# Patient Record
Sex: Male | Born: 1975 | Hispanic: No | Marital: Married | Smoking: Never smoker
Health system: Southern US, Community
[De-identification: ages and names within clinical notes are randomized; demographics above are authoritative.]

## PROBLEM LIST (undated history)

## (undated) DIAGNOSIS — G510 Bell's palsy: Secondary | ICD-10-CM

## (undated) DIAGNOSIS — M109 Gout, unspecified: Secondary | ICD-10-CM

## (undated) DIAGNOSIS — Z98811 Dental restoration status: Secondary | ICD-10-CM

## (undated) DIAGNOSIS — M75100 Unspecified rotator cuff tear or rupture of unspecified shoulder, not specified as traumatic: Secondary | ICD-10-CM

## (undated) DIAGNOSIS — M7540 Impingement syndrome of unspecified shoulder: Secondary | ICD-10-CM

## (undated) DIAGNOSIS — M7500 Adhesive capsulitis of unspecified shoulder: Secondary | ICD-10-CM

## (undated) HISTORY — PX: NO PAST SURGERIES: SHX2092

## (undated) HISTORY — PX: OTHER SURGICAL HISTORY: SHX169

---

## 2011-10-18 ENCOUNTER — Encounter (HOSPITAL_BASED_OUTPATIENT_CLINIC_OR_DEPARTMENT_OTHER): Payer: Self-pay | Admitting: *Deleted

## 2011-10-18 DIAGNOSIS — M75100 Unspecified rotator cuff tear or rupture of unspecified shoulder, not specified as traumatic: Secondary | ICD-10-CM

## 2011-10-18 DIAGNOSIS — M25819 Other specified joint disorders, unspecified shoulder: Secondary | ICD-10-CM

## 2011-10-18 DIAGNOSIS — M7540 Impingement syndrome of unspecified shoulder: Secondary | ICD-10-CM

## 2011-10-18 DIAGNOSIS — M7500 Adhesive capsulitis of unspecified shoulder: Secondary | ICD-10-CM

## 2011-10-18 HISTORY — DX: Impingement syndrome of unspecified shoulder: M75.40

## 2011-10-18 HISTORY — DX: Adhesive capsulitis of unspecified shoulder: M75.00

## 2011-10-18 HISTORY — DX: Other specified joint disorders, unspecified shoulder: M25.819

## 2011-10-18 HISTORY — DX: Unspecified rotator cuff tear or rupture of unspecified shoulder, not specified as traumatic: M75.100

## 2011-10-25 ENCOUNTER — Encounter (HOSPITAL_BASED_OUTPATIENT_CLINIC_OR_DEPARTMENT_OTHER): Payer: Self-pay | Admitting: Anesthesiology

## 2011-10-25 ENCOUNTER — Ambulatory Visit (HOSPITAL_BASED_OUTPATIENT_CLINIC_OR_DEPARTMENT_OTHER)
Admission: RE | Admit: 2011-10-25 | Discharge: 2011-10-25 | Disposition: A | Discharge status: Home / Self Care | Payer: BC Managed Care – PPO | Source: Ambulatory Visit | Attending: Orthopedic Surgery | Admitting: Orthopedic Surgery

## 2011-10-25 ENCOUNTER — Ambulatory Visit (HOSPITAL_BASED_OUTPATIENT_CLINIC_OR_DEPARTMENT_OTHER): Payer: BC Managed Care – PPO | Admitting: Anesthesiology

## 2011-10-25 ENCOUNTER — Encounter (HOSPITAL_BASED_OUTPATIENT_CLINIC_OR_DEPARTMENT_OTHER): Payer: Self-pay

## 2011-10-25 ENCOUNTER — Encounter (HOSPITAL_BASED_OUTPATIENT_CLINIC_OR_DEPARTMENT_OTHER)
Admission: RE | Disposition: A | Discharge status: Home / Self Care | Payer: Self-pay | Source: Ambulatory Visit | Attending: Orthopedic Surgery

## 2011-10-25 DIAGNOSIS — M7541 Impingement syndrome of right shoulder: Secondary | ICD-10-CM

## 2011-10-25 DIAGNOSIS — M659 Unspecified synovitis and tenosynovitis, unspecified site: Secondary | ICD-10-CM | POA: Insufficient documentation

## 2011-10-25 DIAGNOSIS — M24119 Other articular cartilage disorders, unspecified shoulder: Secondary | ICD-10-CM | POA: Insufficient documentation

## 2011-10-25 DIAGNOSIS — S43429A Sprain of unspecified rotator cuff capsule, initial encounter: Secondary | ICD-10-CM | POA: Insufficient documentation

## 2011-10-25 DIAGNOSIS — X58XXXA Exposure to other specified factors, initial encounter: Secondary | ICD-10-CM | POA: Insufficient documentation

## 2011-10-25 DIAGNOSIS — M7500 Adhesive capsulitis of unspecified shoulder: Secondary | ICD-10-CM | POA: Insufficient documentation

## 2011-10-25 HISTORY — DX: Unspecified rotator cuff tear or rupture of unspecified shoulder, not specified as traumatic: M75.100

## 2011-10-25 HISTORY — DX: Dental restoration status: Z98.811

## 2011-10-25 HISTORY — DX: Adhesive capsulitis of unspecified shoulder: M75.00

## 2011-10-25 HISTORY — DX: Impingement syndrome of unspecified shoulder: M75.40

## 2011-10-25 SURGERY — ARTHROSCOPY, SHOULDER, WITH ROTATOR CUFF REPAIR
Anesthesia: General | Site: Shoulder | Laterality: Right | Wound class: Clean

## 2011-10-25 MED ORDER — HYDROMORPHONE HCL PF 1 MG/ML IJ SOLN: 0.2500 mg | INTRAMUSCULAR | Status: DC | PRN

## 2011-10-25 MED ORDER — CEFAZOLIN SODIUM-DEXTROSE 2-3 GM-% IV SOLR: 2.0000 g | Freq: Once | INTRAVENOUS | Status: DC

## 2011-10-25 MED ORDER — DEXAMETHASONE SODIUM PHOSPHATE 4 MG/ML IJ SOLN
INTRAMUSCULAR | Status: DC | PRN
  Administered 2011-10-25: 10 mg via INTRAVENOUS

## 2011-10-25 MED ORDER — OXYCODONE-ACETAMINOPHEN 5-325 MG PO TABS: 1.0000 | ORAL_TABLET | ORAL | Status: AC | PRN

## 2011-10-25 MED ORDER — FENTANYL CITRATE 0.05 MG/ML IJ SOLN
50.0000 ug | INTRAMUSCULAR | Status: DC | PRN
  Administered 2011-10-25: 50 ug via INTRAVENOUS

## 2011-10-25 MED ORDER — SODIUM CHLORIDE 0.9 % IR SOLN
Status: DC | PRN
  Administered 2011-10-25: 5000 mL

## 2011-10-25 MED ORDER — MIDAZOLAM HCL 2 MG/2ML IJ SOLN
1.0000 mg | INTRAMUSCULAR | Status: DC | PRN
  Administered 2011-10-25: 1 mg via INTRAVENOUS

## 2011-10-25 MED ORDER — SUCCINYLCHOLINE CHLORIDE 20 MG/ML IJ SOLN
INTRAMUSCULAR | Status: DC | PRN
  Administered 2011-10-25: 100 mg via INTRAVENOUS

## 2011-10-25 MED ORDER — LACTATED RINGERS IV SOLN
INTRAVENOUS | Status: DC
  Administered 2011-10-25 (×2): via INTRAVENOUS

## 2011-10-25 MED ORDER — PROPOFOL 10 MG/ML IV EMUL
INTRAVENOUS | Status: DC | PRN
  Administered 2011-10-25: 200 mg via INTRAVENOUS

## 2011-10-25 MED ORDER — TRIAMCINOLONE ACETONIDE 40 MG/ML IJ SUSP
INTRAMUSCULAR | Status: DC | PRN
  Administered 2011-10-25: 40 mg via INTRA_ARTICULAR

## 2011-10-25 MED ORDER — ONDANSETRON HCL 4 MG/2ML IJ SOLN
INTRAMUSCULAR | Status: DC | PRN
  Administered 2011-10-25: 4 mg via INTRAVENOUS

## 2011-10-25 MED ORDER — BUPIVACAINE-EPINEPHRINE PF 0.5-1:200000 % IJ SOLN
INTRAMUSCULAR | Status: DC | PRN
  Administered 2011-10-25: 30 mL

## 2011-10-25 MED ORDER — FENTANYL CITRATE 0.05 MG/ML IJ SOLN
INTRAMUSCULAR | Status: DC | PRN
  Administered 2011-10-25: 100 ug via INTRAVENOUS

## 2011-10-25 SURGICAL SUPPLY — 84 items
BENZOIN TINCTURE PRP APPL 2/3 (GAUZE/BANDAGES/DRESSINGS) IMPLANT
BLADE 4.2CUDA (BLADE) IMPLANT
BLADE CUDA 5.5 (BLADE) IMPLANT
BLADE CUTTER GATOR 3.5 (BLADE) IMPLANT
BLADE GREAT WHITE 4.2 (BLADE) IMPLANT
BLADE SURG 15 STRL LF DISP TIS (BLADE) IMPLANT
BLADE SURG 15 STRL SS (BLADE)
BLADE SURG ROTATE 9660 (MISCELLANEOUS) IMPLANT
BUR 3.5 LG SPHERICAL (BURR) IMPLANT
BUR OVAL 4.0 (BURR) ×2 IMPLANT
BUR OVAL 6.0 (BURR) IMPLANT
BURR 3.5 LG SPHERICAL (BURR)
CANISTER OMNI JUG 16 LITER (MISCELLANEOUS) ×2 IMPLANT
CANISTER SUCTION 2500CC (MISCELLANEOUS) IMPLANT
CANNULA 5.75X71 LONG (CANNULA) ×2 IMPLANT
CANNULA TWIST IN 8.25X7CM (CANNULA) IMPLANT
CHLORAPREP W/TINT 26ML (MISCELLANEOUS) ×2 IMPLANT
CLOTH BEACON ORANGE TIMEOUT ST (SAFETY) ×2 IMPLANT
DECANTER SPIKE VIAL GLASS SM (MISCELLANEOUS) IMPLANT
DERMABOND ADVANCED (GAUZE/BANDAGES/DRESSINGS)
DERMABOND ADVANCED .7 DNX12 (GAUZE/BANDAGES/DRESSINGS) IMPLANT
DRAPE INCISE IOBAN 66X45 STRL (DRAPES) ×2 IMPLANT
DRAPE STERI 35X30 U-POUCH (DRAPES) ×2 IMPLANT
DRAPE SURG 17X23 STRL (DRAPES) ×2 IMPLANT
DRAPE U 20/CS (DRAPES) ×2 IMPLANT
DRAPE U-SHAPE 47X51 STRL (DRAPES) ×2 IMPLANT
DRAPE U-SHAPE 76X120 STRL (DRAPES) ×4 IMPLANT
DRSG PAD ABDOMINAL 8X10 ST (GAUZE/BANDAGES/DRESSINGS) ×2 IMPLANT
ELECT REM PT RETURN 9FT ADLT (ELECTROSURGICAL) ×2
ELECTRODE REM PT RTRN 9FT ADLT (ELECTROSURGICAL) ×1 IMPLANT
GAUZE SPONGE 4X4 16PLY XRAY LF (GAUZE/BANDAGES/DRESSINGS) IMPLANT
GAUZE XEROFORM 1X8 LF (GAUZE/BANDAGES/DRESSINGS) ×2 IMPLANT
GLOVE BIO SURGEON STRL SZ7.5 (GLOVE) ×6 IMPLANT
GLOVE BIOGEL M STRL SZ7.5 (GLOVE) ×2 IMPLANT
GLOVE BIOGEL PI IND STRL 8 (GLOVE) ×3 IMPLANT
GLOVE BIOGEL PI INDICATOR 8 (GLOVE) ×3
GLOVE INDICATOR 8.0 STRL GRN (GLOVE) ×2 IMPLANT
GOWN PREVENTION PLUS XLARGE (GOWN DISPOSABLE) ×6 IMPLANT
NDL SUT 6 .5 CRC .975X.05 MAYO (NEEDLE) IMPLANT
NEEDLE 1/2 CIR CATGUT .05X1.09 (NEEDLE) IMPLANT
NEEDLE MAYO TAPER (NEEDLE)
NEEDLE SCORPION MULTI FIRE (NEEDLE) IMPLANT
NS IRRIG 1000ML POUR BTL (IV SOLUTION) IMPLANT
PACK ARTHROSCOPY DSU (CUSTOM PROCEDURE TRAY) ×2 IMPLANT
PACK BASIN DAY SURGERY FS (CUSTOM PROCEDURE TRAY) ×2 IMPLANT
PENCIL BUTTON HOLSTER BLD 10FT (ELECTRODE) IMPLANT
RESECTOR FULL RADIUS 4.2MM (BLADE) ×2 IMPLANT
RESECTOR FULL RADIUS 4.8MM (BLADE) IMPLANT
SHEET MEDIUM DRAPE 40X70 STRL (DRAPES) ×2 IMPLANT
SLEEVE SCD COMPRESS KNEE MED (MISCELLANEOUS) ×2 IMPLANT
SLING ARM FOAM STRAP LRG (SOFTGOODS) IMPLANT
SLING ARM FOAM STRAP MED (SOFTGOODS) IMPLANT
SLING ARM FOAM STRAP XLG (SOFTGOODS) ×2 IMPLANT
SLING ARM IMMOBILIZER MED (SOFTGOODS) IMPLANT
SPONGE GAUZE 4X4 12PLY (GAUZE/BANDAGES/DRESSINGS) ×2 IMPLANT
SPONGE LAP 4X18 X RAY DECT (DISPOSABLE) IMPLANT
STRIP CLOSURE SKIN 1/2X4 (GAUZE/BANDAGES/DRESSINGS) IMPLANT
SUCTION FRAZIER TIP 10 FR DISP (SUCTIONS) IMPLANT
SUPPORT WRAP ARM LG (MISCELLANEOUS) IMPLANT
SUT 2 FIBERLOOP 20 STRT BLUE (SUTURE)
SUT BONE WAX W31G (SUTURE) IMPLANT
SUT ETHIBOND 2 OS 4 DA (SUTURE) IMPLANT
SUT ETHILON 3 0 PS 1 (SUTURE) ×2 IMPLANT
SUT ETHILON 4 0 PS 2 18 (SUTURE) ×2 IMPLANT
SUT FIBERWIRE #2 38 T-5 BLUE (SUTURE)
SUT FIBERWIRE 2-0 18 17.9 3/8 (SUTURE)
SUT MNCRL AB 3-0 PS2 18 (SUTURE) IMPLANT
SUT MNCRL AB 4-0 PS2 18 (SUTURE) IMPLANT
SUT PDS AB 0 CT 36 (SUTURE) IMPLANT
SUT PROLENE 3 0 PS 2 (SUTURE) ×2 IMPLANT
SUT VIC AB 0 CT1 18XCR BRD 8 (SUTURE) IMPLANT
SUT VIC AB 0 CT1 8-18 (SUTURE)
SUT VIC AB 2-0 SH 18 (SUTURE) IMPLANT
SUTURE 2 FIBERLOOP 20 STRT BLU (SUTURE) IMPLANT
SUTURE FIBERWR #2 38 T-5 BLUE (SUTURE) IMPLANT
SUTURE FIBERWR 2-0 18 17.9 3/8 (SUTURE) IMPLANT
SYR BULB 3OZ (MISCELLANEOUS) IMPLANT
TOWEL OR 17X24 6PK STRL BLUE (TOWEL DISPOSABLE) ×2 IMPLANT
TOWEL OR NON WOVEN STRL DISP B (DISPOSABLE) ×2 IMPLANT
TUBE CONNECTING 20X1/4 (TUBING) ×2 IMPLANT
TUBING ARTHROSCOPY IRRIG 16FT (MISCELLANEOUS) ×2 IMPLANT
WAND STAR VAC 90 (SURGICAL WAND) ×2 IMPLANT
WATER STERILE IRR 1000ML POUR (IV SOLUTION) ×2 IMPLANT
YANKAUER SUCT BULB TIP NO VENT (SUCTIONS) IMPLANT

## 2011-10-25 NOTE — Anesthesia Postprocedure Evaluation (Signed)
  Anesthesia Post-op Note  Patient: Stephen Vega  Procedure(s) Performed: Procedure(s) (LRB): SHOULDER ARTHROSCOPY WITH ROTATOR CUFF REPAIR (Right)  Patient Location: PACU  Anesthesia Type: GA combined with regional for post-op pain  Level of Consciousness: awake, alert  and oriented  Airway and Oxygen Therapy: Patient Spontanous Breathing  Post-op Pain: none  Post-op Assessment: Post-op Vital signs reviewed, Patient's Cardiovascular Status Stable, Respiratory Function Stable, Patent Airway, No signs of Nausea or vomiting and Pain level controlled  Post-op Vital Signs: Reviewed and stable  Complications: No apparent anesthesia complications

## 2011-10-25 NOTE — Discharge Instructions (Signed)
Discharge Instructions after Arthroscopic Shoulder Surgery ° ° °A sling has been provided for you. You may remove the sling after 72 hours. The sling may be worn for your protection, if you are in a crowd.  °Use ice on the shoulder intermittently over the first 48 hours after surgery.  °Pain medication has been prescribed for you.  °Use your medication liberally over the first 48 hours, and then begin to taper your use. You may take Extra Strength Tylenol or Tylenol only in place of the pain pills. DO NOT take ANY nonsteroidal anti-inflammatory pain medications: Advil, Motrin, Ibuprofen, Aleve, Naproxen, or Naprosyn.  °You may remove your dressing after two days.  °You may shower 5 days after surgery. The incision CANNOT get wet prior to 5 days. Simply allow the water to wash over the site and then pat dry. Do not rub the incision. Make sure your axilla (armpit) is completely dry after showering.  °Take one aspirin a day for 2 weeks after surgery, unless you have an aspirin sensitivity/allergy or asthma.  °Three to 5 times each day you should perform assisted overhead reaching and external rotation (outward turning) exercises with the operative arm. Both exercises should be done with the non-operative arm used as the "therapist arm" while the operative arm remains relaxed. Ten of each exercise should be done three to five times each day. ° ° ° °Overhead reach is helping to lift your stiff arm up as high as it will go. To stretch your overhead reach, lie flat on your back, relax, and grasp the wrist of the tight shoulder with your opposite hand. Using the power in your opposite arm, bring the stiff arm up as far as it is comfortable. Start holding it for ten seconds and then work up to where you can hold it for a count of 30. Breathe slowly and deeply while the arm is moved. Repeat this stretch ten times, trying to help the arm up a little higher each time.  ° ° ° ° ° °External rotation is turning the arm out to  the side while your elbow stays close to your body. External rotation is best stretched while you are lying on your back. Hold a cane, yardstick, broom handle, or dowel in both hands. Bend both elbows to a right angle. Use steady, gentle force from your normal arm to rotate the hand of the stiff shoulder out away from your body. Continue the rotation as far as it will go comfortably, holding it there for a count of 10. Repeat this exercise ten times.  ° ° ° °Please call [phone] during normal business hours or [phone] after hours for any problems. Including the following: ° °- excessive redness of the incisions °- drainage for more than 4 days °- fever of more than 101.5 F ° °*Please note that pain medications will not be refilled after hours or on weekends. ° ° ° °Post Anesthesia Home Care Instructions ° °Activity: °Get plenty of rest for the remainder of the day. A responsible adult should stay with you for 24 hours following the procedure.  °For the next 24 hours, DO NOT: °-Drive a car °-Operate machinery °-Drink alcoholic beverages °-Take any medication unless instructed by your physician °-Make any legal decisions or sign important papers. ° °Meals: °Start with liquid foods such as gelatin or soup. Progress to regular foods as tolerated. Avoid greasy, spicy, heavy foods. If nausea and/or vomiting occur, drink only clear liquids until the nausea and/or vomiting subsides. Call   your physician if vomiting continues. ° °Special Instructions/Symptoms: °Your throat may feel dry or sore from the anesthesia or the breathing tube placed in your throat during surgery. If this causes discomfort, gargle with warm salt water. The discomfort should disappear within 24 hours. ° ° °Regional Anesthesia Blocks ° °1. Numbness or the inability to move the "blocked" extremity may last from 3-48 hours after placement. The length of time depends on the medication injected and your individual response to the medication. If the  numbness is not going away after 48 hours, call your surgeon. ° °2. The extremity that is blocked will need to be protected until the numbness is gone and the  Strength has returned. Because you cannot feel it, you will need to take extra care to avoid injury. Because it may be weak, you may have difficulty moving it or using it. You may not know what position it is in without looking at it while the block is in effect. ° °3. For blocks in the legs and feet, returning to weight bearing and walking needs to be done carefully. You will need to wait until the numbness is entirely gone and the strength has returned. You should be able to move your leg and foot normally before you try and bear weight or walk. You will need someone to be with you when you first try to ensure you do not fall and possibly risk injury. ° °4. Bruising and tenderness at the needle site are common side effects and will resolve in a few days. ° °5. Persistent numbness or new problems with movement should be communicated to the surgeon or the [facility] ([phone])/ [facility] ([phone]). °

## 2011-10-25 NOTE — Progress Notes (Signed)
Assisted Dr. Jackson with right, interscalene  block. Side rails up, monitors on throughout procedure. See vital signs in flow sheet. Tolerated Procedure well. 

## 2011-10-25 NOTE — Transfer of Care (Signed)
Immediate Anesthesia Transfer of Care Note  Patient: Stephen Vega  Procedure(s) Performed: Procedure(s) (LRB): SHOULDER ARTHROSCOPY WITH ROTATOR CUFF REPAIR (Right)  Patient Location: PACU  Anesthesia Type: GA combined with regional for post-op pain  Level of Consciousness: awake, alert  and oriented  Airway & Oxygen Therapy: Patient Spontanous Breathing and Patient connected to face mask oxygen  Post-op Assessment: Report given to PACU RN and Post -op Vital signs reviewed and stable  Post vital signs: Reviewed and stable  Complications: No apparent anesthesia complications

## 2011-10-25 NOTE — Anesthesia Preprocedure Evaluation (Signed)
Anesthesia Evaluation  Patient identified by MRN, date of birth, ID band Patient awake    Reviewed: Allergy & Precautions, H&P , NPO status , Patient's Chart, lab work & pertinent test results  History of Anesthesia Complications Negative for: history of anesthetic complications  Airway Mallampati: II TM Distance: >3 FB Neck ROM: Full    Dental No notable dental hx. (+) Chipped, Caps and Dental Advisory Given   Pulmonary neg pulmonary ROS,  breath sounds clear to auscultation  Pulmonary exam normal       Cardiovascular negative cardio ROS  Rhythm:Regular Rate:Normal     Neuro/Psych negative neurological ROS  negative psych ROS   GI/Hepatic negative GI ROS, Neg liver ROS,   Endo/Other  Morbid obesity  Renal/GU negative Renal ROS     Musculoskeletal   Abdominal (+) + obese,   Peds  Hematology negative hematology ROS (+)   Anesthesia Other Findings   Reproductive/Obstetrics                           Anesthesia Physical Anesthesia Plan  ASA: I  Anesthesia Plan: General   Post-op Pain Management:    Induction: Intravenous  Airway Management Planned: Oral ETT  Additional Equipment:   Intra-op Plan:   Post-operative Plan: Extubation in OR  Informed Consent: I have reviewed the patients History and Physical, chart, labs and discussed the procedure including the risks, benefits and alternatives for the proposed anesthesia with the patient or authorized representative who has indicated his/her understanding and acceptance.   Dental advisory given  Plan Discussed with: CRNA and Surgeon  Anesthesia Plan Comments: (Plan routine monitors, GETA with interscalene block for post-op analgesia)        Anesthesia Quick Evaluation

## 2011-10-25 NOTE — Op Note (Signed)
Procedure(s): SHOULDER ARTHROSCOPY WITH ROTATOR CUFF REPAIR Procedure Note  Stephen Vega male 36 y.o. 10/25/2011  Procedure(s) and Anesthesia Type:    * Right SHOULDER ARTHROSCOPY WITH lysis of adhesions, subacromial decompression, manipulation under anesthesia - General  Postoperative diagnosis: Right shoulder impingement, frozen shoulder, synovitis.  Surgeon(s) and Role:    * Mable Paris, MD - Primary     Surgeon: Mable Paris   Assistants: None  Anesthesia: General endotracheal anesthesia and regional    Procedure Detail  SHOULDER ARTHROSCOPY WITH ROTATOR CUFF REPAIR  Estimated Blood Loss: Min         Drains: none  Blood Given: none         Specimens: none        Complications:  * No complications entered in OR log *         Disposition: PACU - hemodynamically stable.         Condition: stable    Procedure:   INDICATIONS FOR SURGERY: The patient is 36 y.o. male who has had over a six-month history of right shoulder pain and stiffness. He had an MRI which showed high-grade partial to near full-thickness anterior rotator cuff tear and unfavorable anterior acromial anatomy. There is also some question of possible subscapularis tear. He was noted clinically to have significant stiffness. He failed conservative management and given his MRI findings was indicated for arthroscopic examination and surgery. He understood risks benefits and alternatives to the procedure including but not limited to risk of bleeding infection damage to neurovascular structures, risk of incomplete pain relief, and recurrent stiffness. He elected to go forward with surgery.  OPERATIVE FINDINGS: Examination under anesthesia: Limited motion with 170 forward flexion, 40 external rotation at the side, 80 abducted external rotation, 0 abducted internal rotation. After a gentle manipulation his motion was increased to 180 forward flexion, 70 external rotation at the side,  100 abducted external rotation, and 60 abducted internal rotation. No instability was noted. Diagnostic Arthroscopy:  Glenoid articular cartilage: Intact Humeral head articular cartilage: Intact Labrum: Mild degenerative changes and synovitis Loose bodies: None Synovitis: Extensive throughout the humeral joint and subacromial space. Articular sided rotator cuff: Intact, synovitis. Bursal sided rotator cuff: Intact, synovitis. Coracoacromial ligament: Thickened and with synovitis.  DESCRIPTION OF PROCEDURE: The patient was identified in preoperative  holding area where I personally marked the operative site after  verifying site, side, and procedure with the patient. An interscalene block was given by the attending anesthesiologist the holding area.  The patient was taken back to the operating room where general anesthesia was induced without complication and was placed in the beach-chair position with the back  elevated about 60 degrees and all extremities and head and neck carefully padded and  positioned.   The right upper extremity was then prepped and  draped in a standard sterile fashion. The appropriate time-out  procedure was carried out. The patient did receive IV antibiotics  within 30 minutes of incision.   A small posterior portal incision was made and the arthroscope was introduced into the joint. An anterior portal was then established above the subscapularis using needle localization. Small cannula was placed anteriorly. Diagnostic arthroscopy was then carried out with findings as described above.  There was extensive synovitis noted in the joint. The shaver and ArthriCare were used through the anterior portal to perform extensive synovectomy. Given the stiffness noted on exam I also did a limited capsular release in the rotator interval. Great care was taken to protect structures  of the rotator interval. The subscapularis was carefully examined and found to be completely  intact. The superior rotator cuff and posterior rotator cuff were also carefully examined and found to be completely intact. Special attention was paid to the area just posterior to the biceps tendon given the findings on MRI. The biceps tendon was completely intact. There is no exposed tuberosity and the rotator cuff was noted to be completely intact. Camera was placed in the anterior portal to examine posterior structures. The posterior labrum and capsule were intact with some synovitis.  The arthroscope was then introduced into the subacromial space a standard lateral portal was established with needle localization. The shaver was used through the lateral portal to perform extensive bursectomy. Coracoacromial ligament was examined and found to be thickened and synovitic. After extensive bursectomy the bursal surface of the rotator cuff was carefully examined. The entire anterior and posterior bursal surface of the rotator cuff was intact without any full or partial thickness tearing. There was some erythema anterior on the rotator cuff consistent with impingement.  The coracoacromial ligament was taken down off the anterior acromion with the ArthroCare exposing a moderate anterior acromial spur. A high-speed bur was then used through the lateral portal to take down the anterior acromial spur from lateral to medial in a standard acromioplasty.  The acromioplasty was also viewed from the lateral portal and the bur was used as necessary to ensure that the acromion was completely flat from posterior to anterior.  The arthroscopic equipment was removed from the joint and the portals were closed with 3-0 nylon in an interrupted fashion. 40 mg of Kenalog were introduced into the glenohumeral joint prior to closure of portals.  Sterile dressings were then applied including Xeroform 4 x 4's ABDs and tape. After dressings were applied a second gentle manipulation was performed. The patient was then allowed to awaken  from general anesthesia, placed in a sling, transferred to the stretcher and taken to the recovery room in stable condition.   POSTOPERATIVE PLAN: The patient will be discharged home today and will followup in one week for suture removal and wound check.  He will begin aggressive stretching exercises within the next couple days and we'll start formal physical therapy at his first postoperative visit.Marland Kitchen

## 2011-10-25 NOTE — Anesthesia Procedure Notes (Addendum)
Anesthesia Regional Block:  Interscalene brachial plexus block  Pre-Anesthetic Checklist: ,, timeout performed, Correct Patient, Correct Site, Correct Laterality, Correct Procedure, Correct Position, site marked, Risks and benefits discussed,  Surgical consent,  Pre-op evaluation,  At surgeon's request and post-op pain management  Laterality: Right  Prep: chloraprep       Needles:  Injection technique: Single-shot  Needle Type: Stimulator Needle - 40      Needle Gauge: 22 and 22 G    Additional Needles:  Procedures: nerve stimulator Interscalene brachial plexus block  Nerve Stimulator or Paresthesia:  Response: forearm twitch, 0.5 mA, 0.1 ms,   Additional Responses:   Narrative:  Start time: 10/25/2011 12:36 PM End time: 10/25/2011 12:41 PM Injection made incrementally with aspirations every 5 mL.  Performed by: Personally  Anesthesiologist: Sandford Craze, MD  Additional Notes: Pt identified in Holding room.  Monitors applied. Working IV access confirmed. Sterile prep R neck.  #22ga PNS to forearm twitch at 0.69mA threshold.  30cc 0.5% Bupivacaine with 1:200k epi injected incrementally after negative test dose.  Patient asymptomatic, VSS, no heme aspirated, tolerated well.   Sandford Craze, MD  Interscalene brachial plexus block Procedure Name: Intubation Date/Time: 10/25/2011 1:29 PM Performed by: Burna Cash Pre-anesthesia Checklist: Patient identified, Emergency Drugs available, Suction available and Patient being monitored Patient Re-evaluated:Patient Re-evaluated prior to inductionOxygen Delivery Method: Circle System Utilized Preoxygenation: Pre-oxygenation with 100% oxygen Intubation Type: IV induction Ventilation: Mask ventilation without difficulty Laryngoscope Size: Mac and 3 Grade View: Grade I Tube type: Oral Number of attempts: 1 Airway Equipment and Method: stylet and oral airway Placement Confirmation: ETT inserted through vocal cords under direct vision,   positive ETCO2 and breath sounds checked- equal and bilateral Secured at: 22 cm Tube secured with: Tape Dental Injury: Teeth and Oropharynx as per pre-operative assessment

## 2011-10-25 NOTE — H&P (Signed)
Stephen Vega is an 35 y.o. male.   Chief Complaint: R shoudler pain and stiffness HPI: R shoulder pain and stiffness with MRI confirmed near full thickness rotator cuff tear and impingement.  Past Medical History  Diagnosis Date  . Dental crowns present   . Rotator cuff tear 10/2011    right  . Shoulder impingement 10/2011    right  . 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 No past surgeries     History reviewed. No pertinent family history. Social History:  reports that he has never smoked. He has never used smokeless tobacco. He reports that he drinks alcohol. He reports that he does not use illicit drugs.  Allergies: No Known Allergies  Medications Prior to Admission  Medication Dose Route Frequency Provider Last Rate Last Dose  . fentaNYL (SUBLIMAZE) injection 50-100 mcg  50-100 mcg Intravenous PRN Germaine Pomfret, MD   50 mcg at 10/25/11 1236  . lactated ringers infusion   Intravenous Continuous Hart Robinsons, MD 20 mL/hr at 10/25/11 1213    . midazolam (VERSED) injection 1-2 mg  1-2 mg Intravenous PRN E. Jairo Ben, MD   1 mg at 10/25/11 1236   No current outpatient prescriptions on file as of 10/25/2011.    No results found for this or any previous visit (from the past 48 hour(s)). No results found.  Review of Systems  All other systems reviewed and are negative.    Blood pressure 132/67, pulse 71, temperature 98 F (36.7 C), temperature source Oral, resp. rate 20, height 5\' 8"  (1.727 m), weight 92.987 kg (205 lb), SpO2 100.00%. Physical Exam  Constitutional: He is oriented to person, place, and time. He appears well-developed and well-nourished.  HENT:  Head: Atraumatic.  Eyes: EOM are normal.  Cardiovascular: Intact distal pulses.   Respiratory: Effort normal.  Musculoskeletal:       Right shoulder: He exhibits decreased range of motion and tenderness.  Neurological: He is alert and oriented to person, place,  and time.  Skin: Skin is warm and dry.  Psychiatric: He has a normal mood and affect.     Assessment/Plan R shoulder impingement/ RCT with stiffness. Plan for arthroscopic exam, poss RCR vs debridement, acromioplasty and possible caps release and MUA. Risks / benefits of surgery discussed Consent on chart  NPO for OR Preop antibiotics   Thelda Gagan WILLIAM 10/25/2011, 1:09 PM

## 2015-11-19 DIAGNOSIS — Z6835 Body mass index (BMI) 35.0-35.9, adult: Secondary | ICD-10-CM | POA: Diagnosis not present

## 2015-11-19 DIAGNOSIS — M109 Gout, unspecified: Secondary | ICD-10-CM | POA: Diagnosis not present

## 2016-05-21 DIAGNOSIS — E663 Overweight: Secondary | ICD-10-CM | POA: Diagnosis not present

## 2016-05-21 DIAGNOSIS — M109 Gout, unspecified: Secondary | ICD-10-CM | POA: Diagnosis not present

## 2016-05-21 DIAGNOSIS — Z1389 Encounter for screening for other disorder: Secondary | ICD-10-CM | POA: Diagnosis not present

## 2016-05-21 DIAGNOSIS — Z9181 History of falling: Secondary | ICD-10-CM | POA: Diagnosis not present

## 2016-05-21 DIAGNOSIS — Z6836 Body mass index (BMI) 36.0-36.9, adult: Secondary | ICD-10-CM | POA: Diagnosis not present

## 2016-10-08 DIAGNOSIS — G4452 New daily persistent headache (NDPH): Secondary | ICD-10-CM | POA: Diagnosis not present

## 2016-10-08 DIAGNOSIS — Z6836 Body mass index (BMI) 36.0-36.9, adult: Secondary | ICD-10-CM | POA: Diagnosis not present

## 2016-10-08 DIAGNOSIS — E669 Obesity, unspecified: Secondary | ICD-10-CM | POA: Diagnosis not present

## 2016-10-22 DIAGNOSIS — Z6837 Body mass index (BMI) 37.0-37.9, adult: Secondary | ICD-10-CM | POA: Diagnosis not present

## 2016-10-22 DIAGNOSIS — R030 Elevated blood-pressure reading, without diagnosis of hypertension: Secondary | ICD-10-CM | POA: Diagnosis not present

## 2016-10-22 DIAGNOSIS — E669 Obesity, unspecified: Secondary | ICD-10-CM | POA: Diagnosis not present

## 2016-11-23 DIAGNOSIS — R030 Elevated blood-pressure reading, without diagnosis of hypertension: Secondary | ICD-10-CM | POA: Diagnosis not present

## 2016-11-23 DIAGNOSIS — M109 Gout, unspecified: Secondary | ICD-10-CM | POA: Diagnosis not present

## 2016-11-23 DIAGNOSIS — E669 Obesity, unspecified: Secondary | ICD-10-CM | POA: Diagnosis not present

## 2016-11-23 DIAGNOSIS — Z6837 Body mass index (BMI) 37.0-37.9, adult: Secondary | ICD-10-CM | POA: Diagnosis not present

## 2016-12-28 DIAGNOSIS — R748 Abnormal levels of other serum enzymes: Secondary | ICD-10-CM | POA: Diagnosis not present

## 2017-01-01 DIAGNOSIS — R748 Abnormal levels of other serum enzymes: Secondary | ICD-10-CM | POA: Diagnosis not present

## 2017-01-01 DIAGNOSIS — R945 Abnormal results of liver function studies: Secondary | ICD-10-CM | POA: Diagnosis not present

## 2017-04-04 DIAGNOSIS — R748 Abnormal levels of other serum enzymes: Secondary | ICD-10-CM | POA: Diagnosis not present

## 2017-04-05 DIAGNOSIS — K760 Fatty (change of) liver, not elsewhere classified: Secondary | ICD-10-CM | POA: Diagnosis not present

## 2017-04-19 ENCOUNTER — Other Ambulatory Visit: Payer: Self-pay | Admitting: Unknown Physician Specialty

## 2017-04-19 DIAGNOSIS — K760 Fatty (change of) liver, not elsewhere classified: Secondary | ICD-10-CM

## 2017-04-28 ENCOUNTER — Ambulatory Visit: Admission: RE | Admit: 2017-04-28 | Payer: 59 | Source: Ambulatory Visit

## 2017-05-22 IMAGING — US US BIOPSY CORE LIVER
1 series · 10 of 10 positions shown · non-contrast
Comparison: Abdominal ultrasound - [DATE]

INDICATION: Elevated LFTs of uncertain etiology. Please perform
ultrasound-guided liver biopsy for tissue diagnostic purposes.

EXAM:
ULTRASOUND GUIDED LIVER BIOPSY

[Series 1: us biopsy core liver · 0.26mm/px · 10 of 10 slices shown]
[im 1/10]
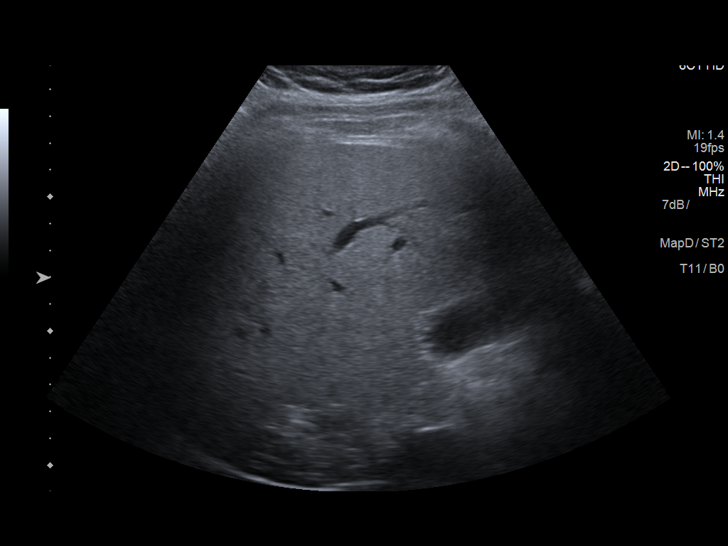
[im 2/10]
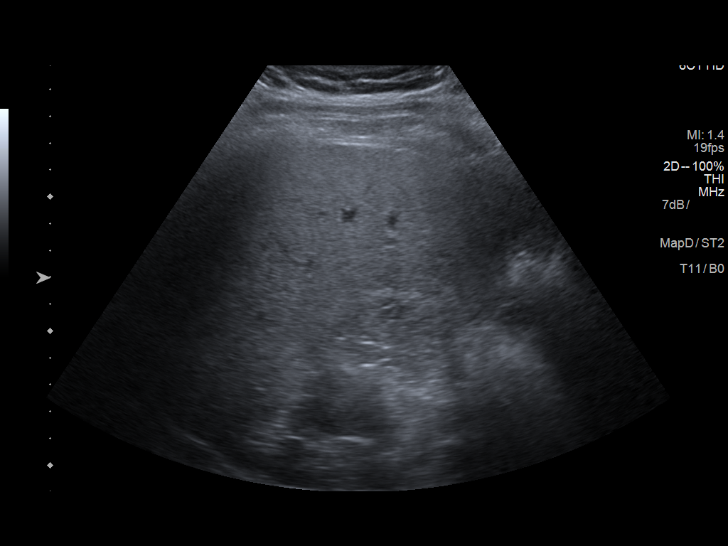
[im 3/10]
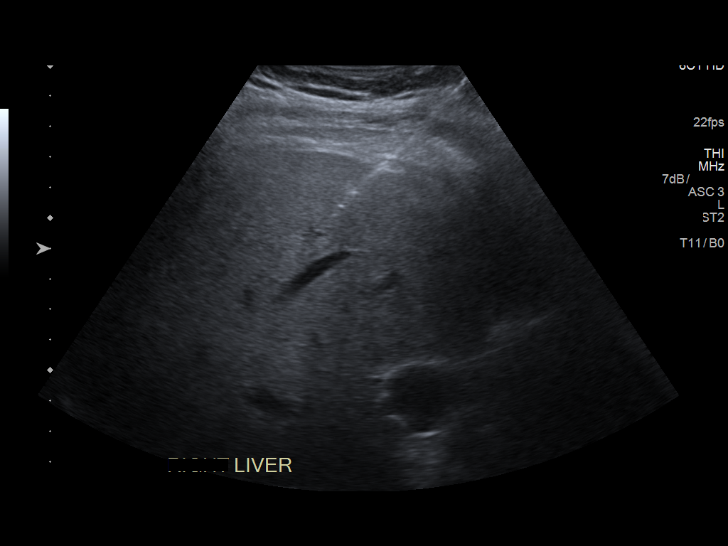
[im 4/10]
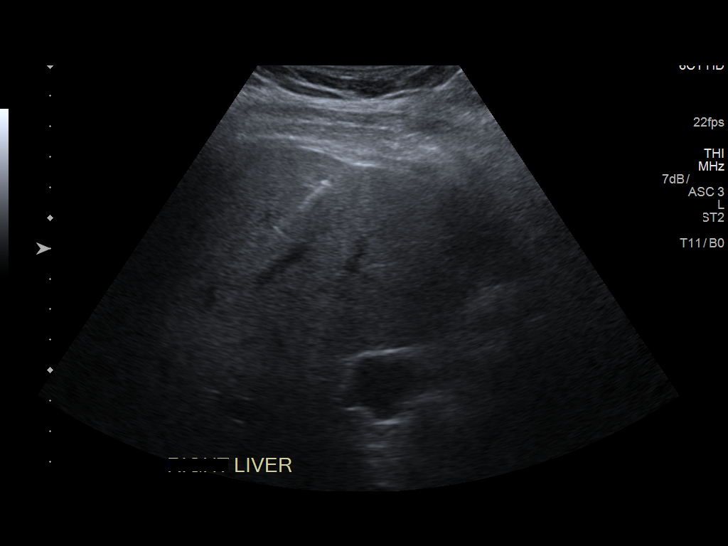
[im 5/10]
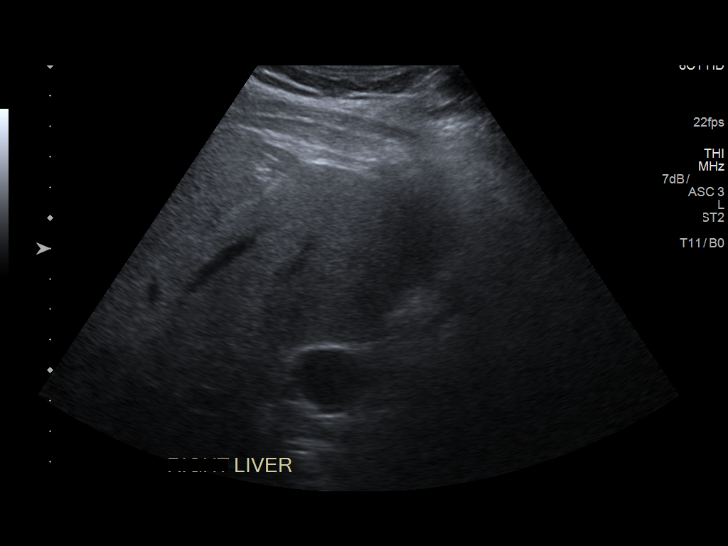
[im 6/10]
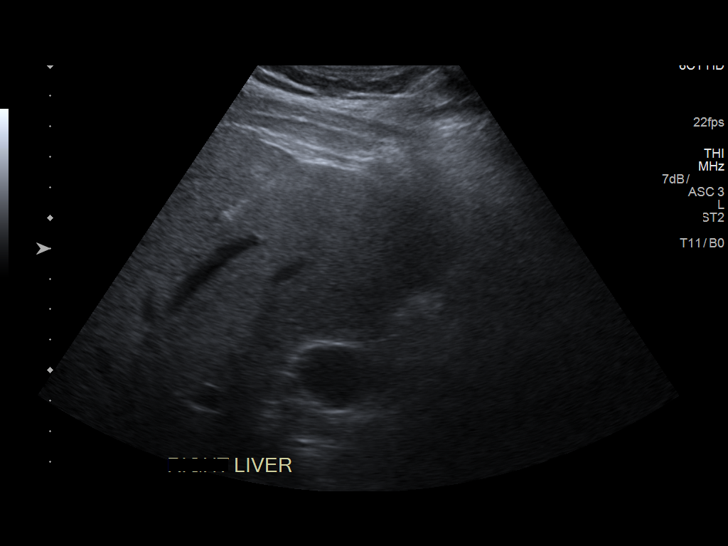
[im 7/10]
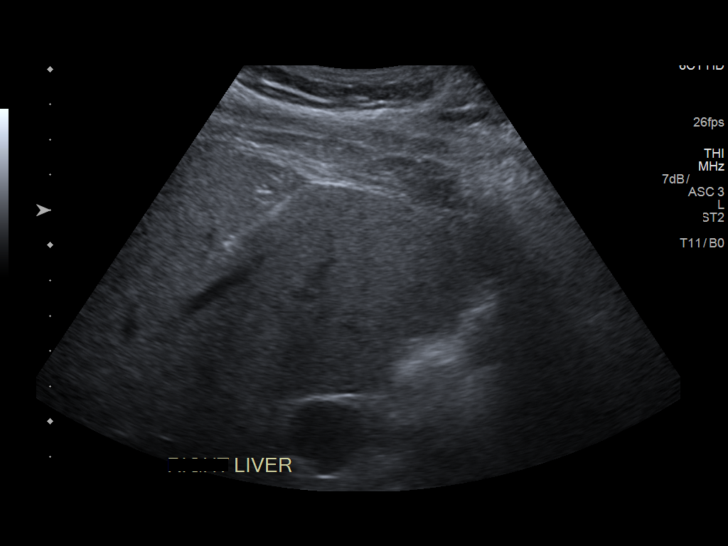
[im 8/10]
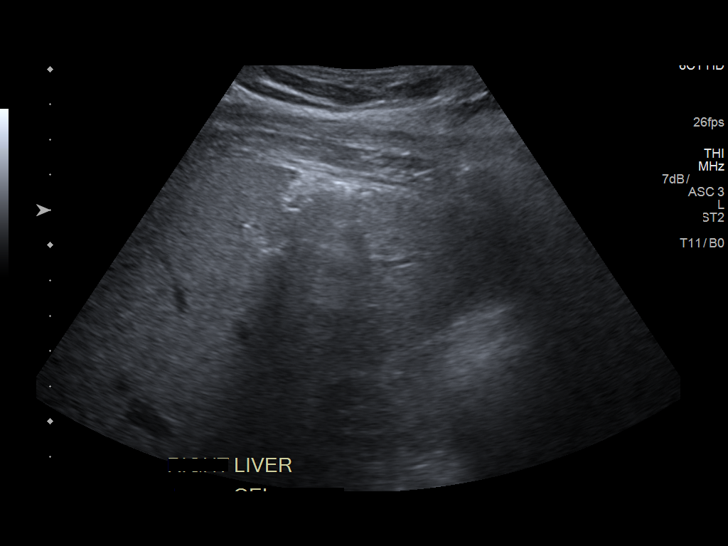
[im 9/10]
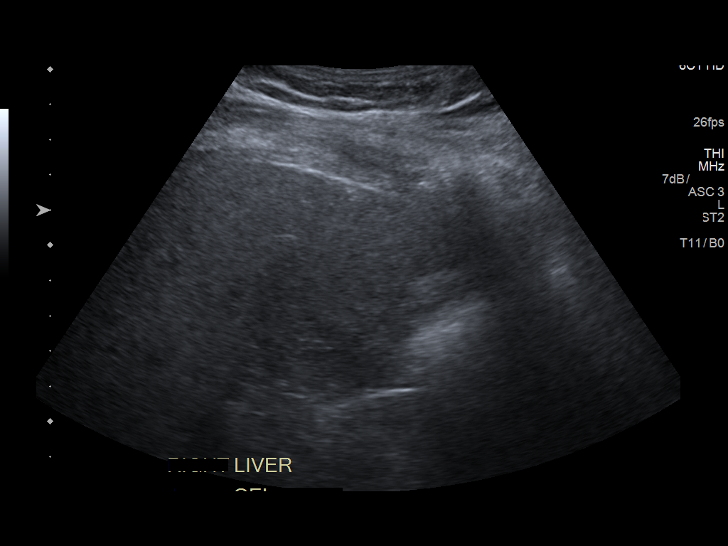
[im 10/10]
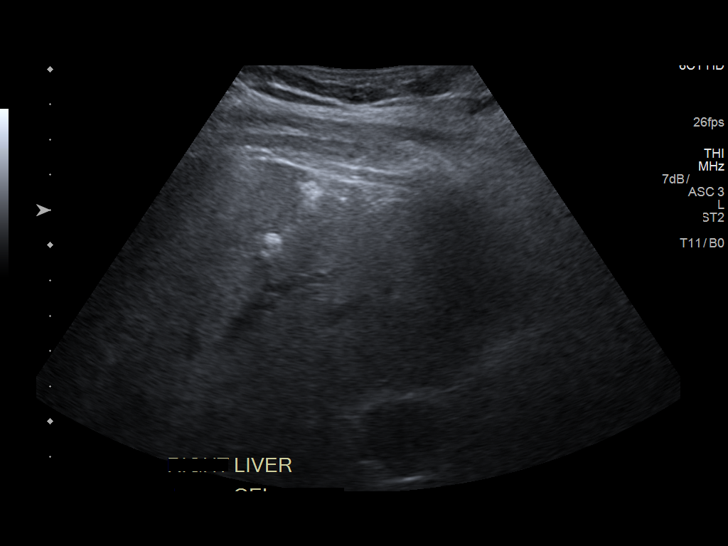

[10 of 10 positions shown; findings below may reference images not displayed]

MEDICATIONS:
None

ANESTHESIA/SEDATION:
Fentanyl 50 mcg IV; Versed 2 mg IV

Total Moderate Sedation time: 12 minutes; The patient was
continuously monitored during the procedure by the interventional
radiology nurse under my direct supervision.

COMPLICATIONS:
None immediate.

PROCEDURE:
Informed written consent was obtained from the patient after a
discussion of the risks, benefits and alternatives to treatment. The
patient understands and consents the procedure. A timeout was
performed prior to the initiation of the procedure.

Ultrasound scanning was performed of the right upper abdominal
quadrant and the procedure was planned. The right upper abdomen was
prepped and draped in the usual sterile fashion. The overlying soft
tissues were anesthetized with 1% lidocaine with epinephrine. A 17
gauge, 6.8 cm co-axial needle was advanced into a peripheral aspect
of the right lobe of the liver and 3 core biopsies were obtained
with an 18 gauge core device under direct ultrasound guidance.

The co-axial needle track was embolized with the administration of a
Gel-Foam slurry. Superficial hemostasis was obtained with manual
compression. Post procedural scanning was negative for definitive
area of hemorrhage. A dressing was placed. The patient tolerated the
procedure well without immediate post procedural complication.
IMPRESSION: Technically successful ultrasound guided liver biopsy.

## 2017-05-24 DIAGNOSIS — K760 Fatty (change of) liver, not elsewhere classified: Secondary | ICD-10-CM | POA: Diagnosis not present

## 2017-05-25 ENCOUNTER — Other Ambulatory Visit: Payer: Self-pay | Admitting: Radiology

## 2017-05-26 ENCOUNTER — Ambulatory Visit
Admission: RE | Admit: 2017-05-26 | Discharge: 2017-05-26 | Disposition: A | Discharge status: Home / Self Care | Payer: 59 | Source: Ambulatory Visit | Attending: Unknown Physician Specialty | Admitting: Unknown Physician Specialty

## 2017-05-26 DIAGNOSIS — R7989 Other specified abnormal findings of blood chemistry: Secondary | ICD-10-CM | POA: Diagnosis present

## 2017-05-26 DIAGNOSIS — K760 Fatty (change of) liver, not elsewhere classified: Secondary | ICD-10-CM

## 2017-05-26 DIAGNOSIS — Z79899 Other long term (current) drug therapy: Secondary | ICD-10-CM | POA: Insufficient documentation

## 2017-05-26 DIAGNOSIS — M109 Gout, unspecified: Secondary | ICD-10-CM | POA: Diagnosis not present

## 2017-05-26 DIAGNOSIS — K7581 Nonalcoholic steatohepatitis (NASH): Secondary | ICD-10-CM | POA: Insufficient documentation

## 2017-05-26 DIAGNOSIS — R945 Abnormal results of liver function studies: Secondary | ICD-10-CM | POA: Diagnosis not present

## 2017-05-26 HISTORY — DX: Bell's palsy: G51.0

## 2017-05-26 HISTORY — DX: Gout, unspecified: M10.9

## 2017-05-26 MED ORDER — SODIUM CHLORIDE 0.9 % IV SOLN
INTRAVENOUS | Status: DC
  Administered 2017-05-26: 09:00:00 via INTRAVENOUS

## 2017-05-26 MED ORDER — MIDAZOLAM HCL 5 MG/5ML IJ SOLN
INTRAMUSCULAR | Status: AC | PRN
  Administered 2017-05-26 (×2): 1 mg via INTRAVENOUS

## 2017-05-26 MED ORDER — MIDAZOLAM HCL 5 MG/5ML IJ SOLN
INTRAMUSCULAR | Status: AC
  Filled 2017-05-26: qty 5

## 2017-05-26 MED ORDER — FENTANYL CITRATE (PF) 100 MCG/2ML IJ SOLN
INTRAMUSCULAR | Status: AC
  Filled 2017-05-26: qty 4

## 2017-05-26 MED ORDER — FENTANYL CITRATE (PF) 100 MCG/2ML IJ SOLN
INTRAMUSCULAR | Status: AC | PRN
  Administered 2017-05-26: 50 ug via INTRAVENOUS

## 2017-05-26 MED ORDER — LIDOCAINE HCL (PF) 1 % IJ SOLN
INTRAMUSCULAR | Status: AC | PRN
  Administered 2017-05-26: 5 mL

## 2017-05-26 NOTE — Progress Notes (Signed)
Patient clinically stable post liver biopsy per Dr Grace IsaacWatts, denies complaints,vitals have remained stable. Wife at bedside. sr per monitor, Dr Grace IsaacWatts out to speak with patient and wife earlier with questions answered.discharge instructions given earlier per [place] with questions answered. Dressing to right side post biopsy c/d/i

## 2017-05-26 NOTE — Consult Note (Signed)
Chief Complaint: Elevated LFTs  Referring Physician(s): Butler,Robert  Patient Status: [facility] - Out-pt  History of Present Illness: Stephen Vega is a 41 y.o. male with no significant past medical history who presents today for ultrasound guided random liver biopsy for the evaluation of incidentally discovered elevated LFTs.  Patient is currently without complaint. Specifically, he denies chest pain, short of breath, fever or chills. No abdominal pain.  Past Medical History:  Diagnosis Date  . Bell's palsy   . Bell's palsy   . Dental crowns present   . 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 Gout   . Rotator cuff tear 10/2011   right  . Shoulder impingement 10/2011   right    Past Surgical History:  Procedure Laterality Date  . bone spur-right shoulder Right   . NO PAST SURGERIES      Allergies: Patient has no known allergies.  Medications: Prior to Admission medications   Medication Sig Start Date End Date Taking? Authorizing Provider  allopurinol (ZYLOPRIM) 300 MG tablet Take 300 mg daily by mouth.   Yes [provider]  colchicine 0.6 MG tablet Take 0.6 mg daily by mouth.   Yes [provider]     History reviewed. No pertinent family history.  Social History   Socioeconomic History  . Marital status: Married    Spouse name: None  . Number of children: None  . Years of education: None  . Highest education level: None  Social Needs  . Financial resource strain: None  . Food insecurity - worry: None  . Food insecurity - inability: None  . Transportation needs - medical: None  . Transportation needs - non-medical: None  Occupational History  . None  Tobacco Use  . Smoking status: Never Smoker  . Smokeless tobacco: Never Used  Substance and Sexual Activity  . Alcohol use: No    Frequency: Never  . Drug use: No  . Sexual activity: None  Other Topics Concern  . None  Social History Narrative  . None    ECOG Status: 0 -  Asymptomatic  Review of Systems: A 12 point ROS discussed and pertinent positives are indicated in the HPI above.  All other systems are negative.  Review of Systems  Constitutional: Negative.   Respiratory: Negative.   Cardiovascular: Negative.   Gastrointestinal: Negative.     Vital Signs: BP 119/84   Pulse 63   Temp 97.8 F (36.6 C) (Oral)   Resp 15   Ht 5\' 8"  (1.727 m)   Wt 243 lb (110.2 kg)   SpO2 97%   BMI 36.95 kg/m   Physical Exam  Constitutional: He appears well-developed and well-nourished.  HENT:  Head: Normocephalic and atraumatic.  Cardiovascular: Normal rate and regular rhythm.  Pulmonary/Chest: Effort normal and breath sounds normal.  Skin: Skin is warm and dry.  Nursing note and vitals reviewed.   Imaging:  Abdominal ultrasound - 12/22/2016   Labs:  CBC: No results for input(s): WBC, HGB, HCT, PLT in the last 8760 hours.  COAGS: No results for input(s): INR, APTT in the last 8760 hours.  BMP: No results for input(s): NA, K, CL, CO2, GLUCOSE, BUN, CALCIUM, CREATININE, GFRNONAA, GFRAA in the last 8760 hours.  Invalid input(s): CMP  LIVER FUNCTION TESTS: No results for input(s): BILITOT, AST, ALT, ALKPHOS, PROT, ALBUMIN in the last 8760 hours.  TUMOR MARKERS: No results for input(s): AFPTM, CEA, CA199, CHROMGRNA in the last 8760 hours.  Assessment and Plan:  Stephen Vega is a 41 y.o. male with no significant past medical history who presents today for ultrasound guided random liver biopsy for the evaluation of incidentally discovered elevated LFTs.  Risks and benefits of US guided liver biopsy were discussed with the patient including, but not limited to bleeding, infection, damage to adjacent structures or low yield requiring additional tests.  All of the patient's questions were answered, patient is agreeable to proceed.  Consent signed and in chart.  A copy of this report was sent to the requesting provider on this  date.  Electronically Signed: Simonne ComeWATTS, Nylan Nevel, MD 05/26/2017, 8:37 AM   I spent a total of 15 Minutes in face to face in clinical consultation, greater than 50% of which was counseling/coordinating care for US guided liver biopsy.

## 2017-05-26 NOTE — Procedures (Signed)
Pre Procedure Dx: Elevated LFTs °Post Procedural Dx: Same ° °Technically successful US guided biopsy of right lobe of the liver. ° °EBL: Trace ° °No immediate complications.  ° °Jay Navi Ewton, MD °Pager #: [phone] ° ° °

## 2017-05-27 LAB — SURGICAL PATHOLOGY

## 2017-05-30 DIAGNOSIS — M109 Gout, unspecified: Secondary | ICD-10-CM | POA: Diagnosis not present

## 2017-05-30 DIAGNOSIS — Z6837 Body mass index (BMI) 37.0-37.9, adult: Secondary | ICD-10-CM | POA: Diagnosis not present

## 2017-06-23 DIAGNOSIS — K7581 Nonalcoholic steatohepatitis (NASH): Secondary | ICD-10-CM | POA: Diagnosis not present

## 2017-06-30 DIAGNOSIS — K7581 Nonalcoholic steatohepatitis (NASH): Secondary | ICD-10-CM | POA: Diagnosis not present

## 2017-08-29 DIAGNOSIS — Z79899 Other long term (current) drug therapy: Secondary | ICD-10-CM | POA: Diagnosis not present

## 2017-08-29 DIAGNOSIS — R635 Abnormal weight gain: Secondary | ICD-10-CM | POA: Diagnosis not present

## 2017-08-29 DIAGNOSIS — M109 Gout, unspecified: Secondary | ICD-10-CM | POA: Diagnosis not present

## 2017-08-29 DIAGNOSIS — K7581 Nonalcoholic steatohepatitis (NASH): Secondary | ICD-10-CM | POA: Diagnosis not present

## 2017-08-29 DIAGNOSIS — R748 Abnormal levels of other serum enzymes: Secondary | ICD-10-CM | POA: Diagnosis not present

## 2017-08-29 DIAGNOSIS — K769 Liver disease, unspecified: Secondary | ICD-10-CM | POA: Diagnosis not present

## 2017-09-06 DIAGNOSIS — R945 Abnormal results of liver function studies: Secondary | ICD-10-CM | POA: Diagnosis not present

## 2017-12-09 DIAGNOSIS — M109 Gout, unspecified: Secondary | ICD-10-CM | POA: Diagnosis not present

## 2017-12-09 DIAGNOSIS — Z0000 Encounter for general adult medical examination without abnormal findings: Secondary | ICD-10-CM | POA: Diagnosis not present

## 2017-12-09 DIAGNOSIS — Z1331 Encounter for screening for depression: Secondary | ICD-10-CM | POA: Diagnosis not present

## 2018-03-28 DIAGNOSIS — R748 Abnormal levels of other serum enzymes: Secondary | ICD-10-CM | POA: Diagnosis not present

## 2018-03-28 DIAGNOSIS — M109 Gout, unspecified: Secondary | ICD-10-CM | POA: Diagnosis not present

## 2018-03-28 DIAGNOSIS — E785 Hyperlipidemia, unspecified: Secondary | ICD-10-CM | POA: Diagnosis not present

## 2018-06-01 DIAGNOSIS — K7581 Nonalcoholic steatohepatitis (NASH): Secondary | ICD-10-CM | POA: Diagnosis not present

## 2018-06-06 DIAGNOSIS — K7581 Nonalcoholic steatohepatitis (NASH): Secondary | ICD-10-CM | POA: Diagnosis not present

## 2018-07-11 DIAGNOSIS — J101 Influenza due to other identified influenza virus with other respiratory manifestations: Secondary | ICD-10-CM | POA: Diagnosis not present

## 2019-04-30 ENCOUNTER — Other Ambulatory Visit (HOSPITAL_COMMUNITY): Payer: Self-pay | Admitting: Nurse Practitioner

## 2019-04-30 DIAGNOSIS — R748 Abnormal levels of other serum enzymes: Secondary | ICD-10-CM

## 2019-05-02 ENCOUNTER — Other Ambulatory Visit: Payer: No Typology Code available for payment source

## 2019-05-03 ENCOUNTER — Other Ambulatory Visit: Payer: Self-pay

## 2019-05-03 ENCOUNTER — Ambulatory Visit (HOSPITAL_COMMUNITY): Payer: No Typology Code available for payment source

## 2019-05-03 ENCOUNTER — Ambulatory Visit
Admission: RE | Admit: 2019-05-03 | Discharge: 2019-05-03 | Disposition: A | Discharge status: Home / Self Care | Payer: No Typology Code available for payment source | Source: Ambulatory Visit | Attending: Nurse Practitioner | Admitting: Nurse Practitioner

## 2019-05-03 DIAGNOSIS — R748 Abnormal levels of other serum enzymes: Secondary | ICD-10-CM | POA: Insufficient documentation

## 2019-05-03 IMAGING — US US ABDOMEN LIMITED W/ ELASTOGRAPHY
1 series · 12 of 25 positions shown · non-contrast
Comparison: [DATE] abdominal sonogram.

CLINICAL DATA: Abnormal liver enzymes.

EXAM:
US ABDOMEN LIMITED - RIGHT UPPER QUADRANT
ULTRASOUND HEPATIC ELASTOGRAPHY
TECHNIQUE: Sonography of the right upper quadrant was performed. In addition,
ultrasound elastography evaluation of the liver was performed. A
region of interest was placed within the right lobe of the liver.
Following application of a compressive sonographic pulse, tissue
compressibility was assessed. Multiple assessments were performed at
the selected site. Median tissue compressibility was determined.
Previously, hepatic stiffness was assessed by shear wave velocity.
Based on recently published Society of Radiologists in Ultrasound
consensus article, reporting is now recommended to be performed in
the SI units of pressure (kiloPascals) representing hepatic
stiffness/elasticity. The obtained result is compared to the
published reference standards. (cACLD= compensated Advanced Chronic
Liver Disease)

[Series 1: us abdomen limited w/ elastography · 12 of 75 slices shown]
[im 4/75]
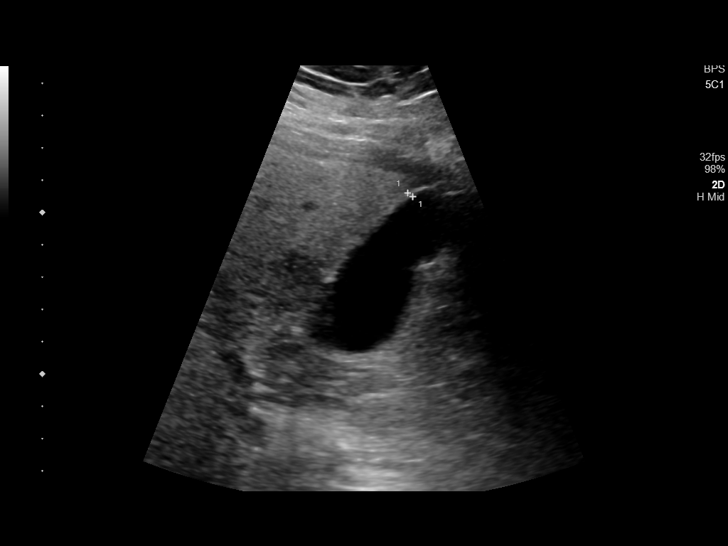
[im 10/75]
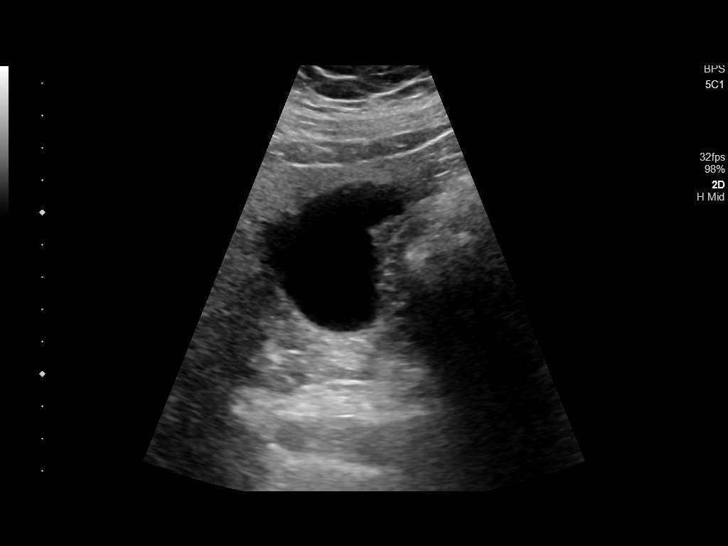
[im 16/75]
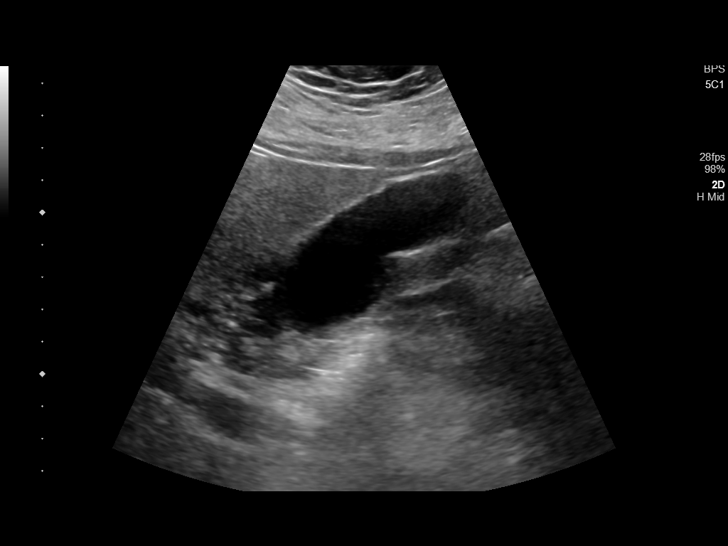
[im 22/75]
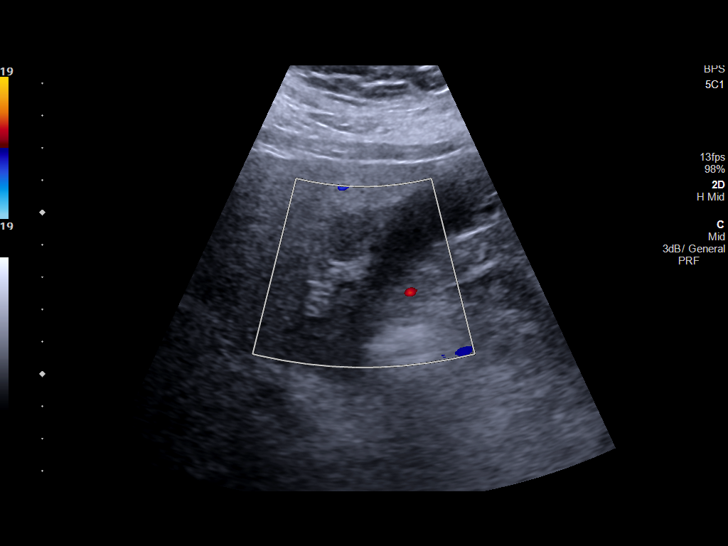
[im 28/75]
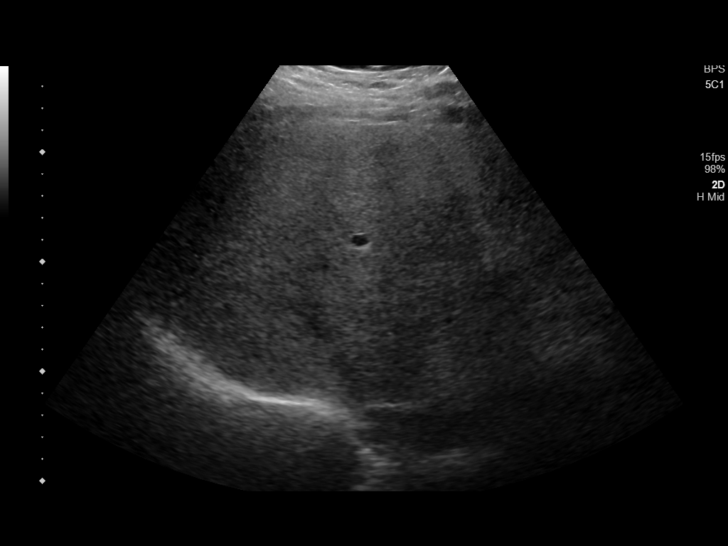
[im 34/75]
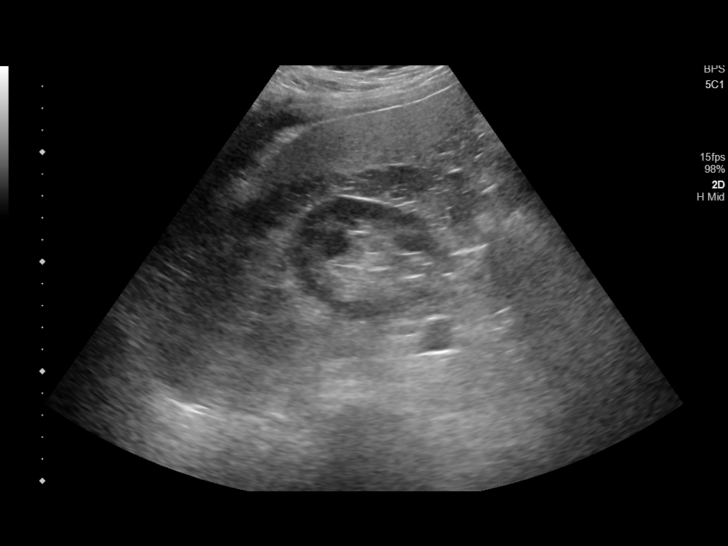
[im 41/75]
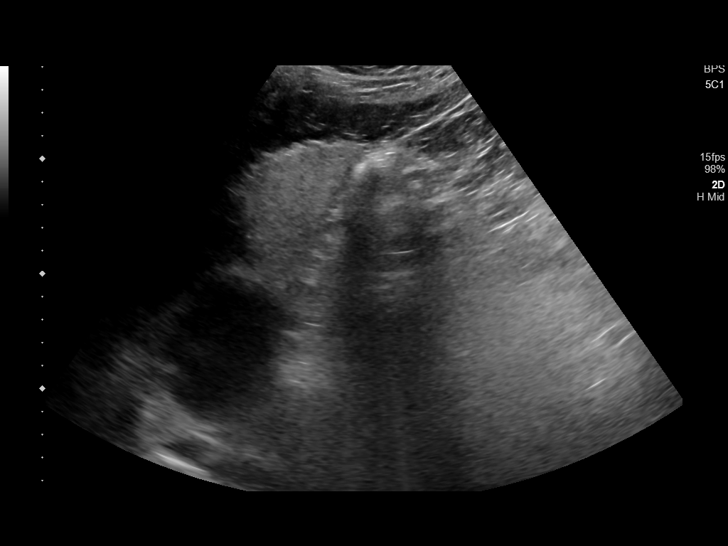
[im 47/75]
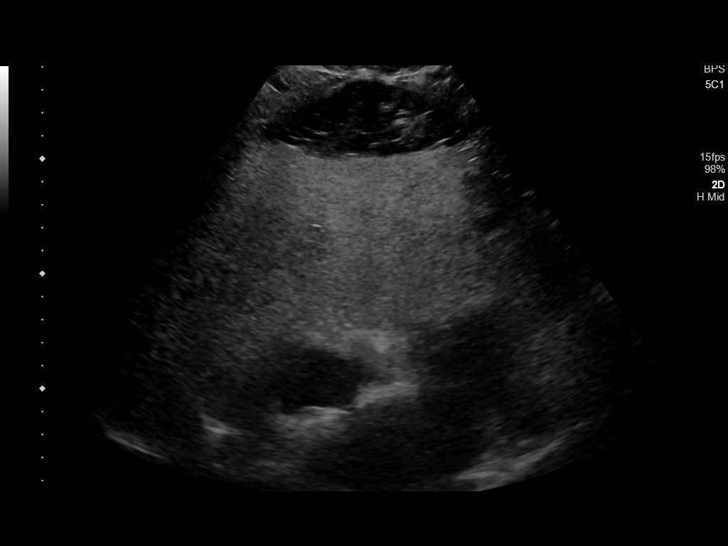
[im 53/75]
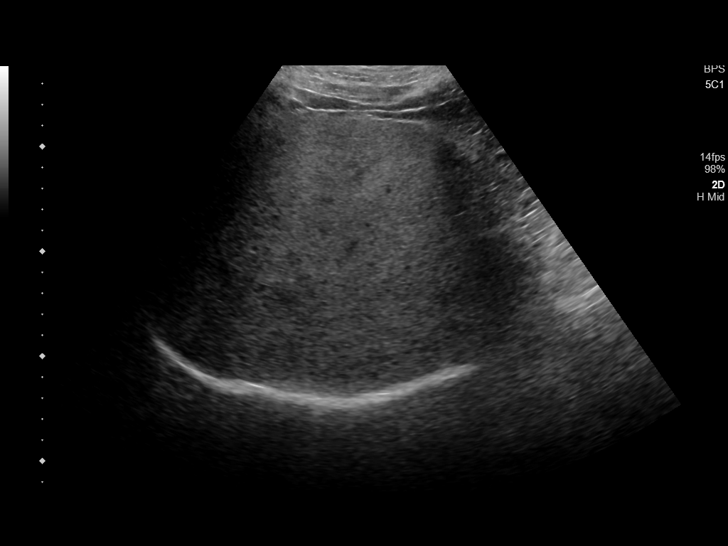
[im 59/75]
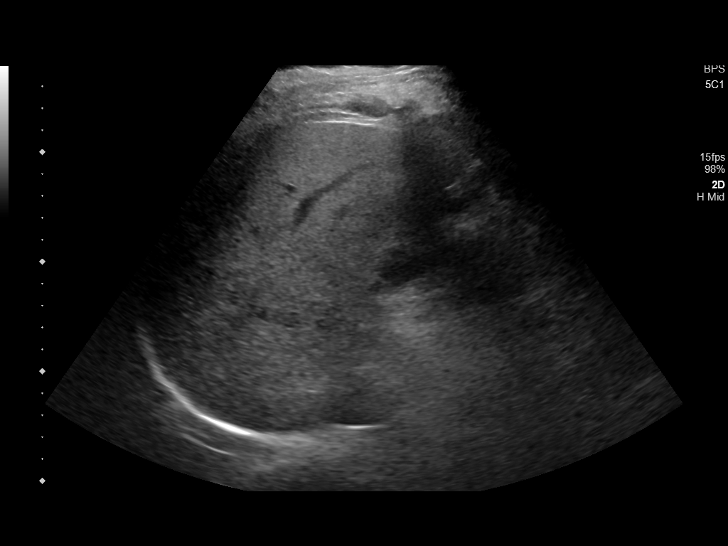
[im 65/75]
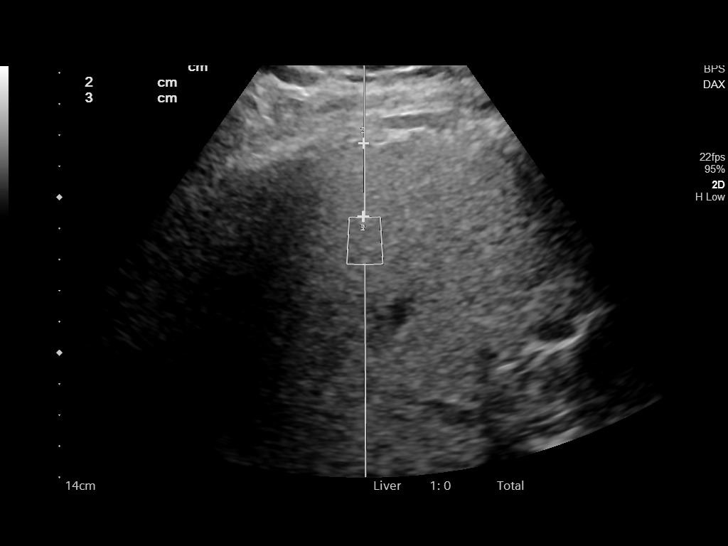
[im 71/75]
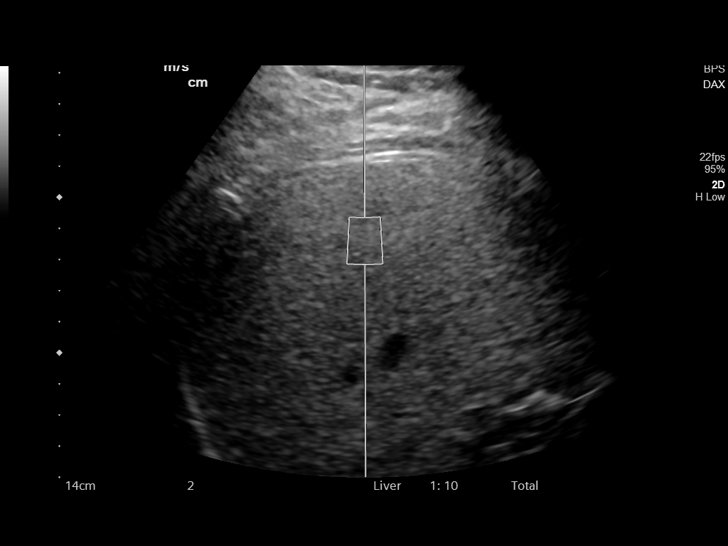

[12 of 25 positions shown; findings below may reference images not displayed]

FINDINGS: ULTRASOUND ABDOMEN LIMITED RIGHT UPPER QUADRANT

Gallbladder:

No gallstones or wall thickening visualized. No sonographic Murphy
sign noted.

Common bile duct:

Diameter: 3 mm

Liver:

Background liver parenchyma is diffusely echogenic with posterior
acoustic attenuation, compatible with diffuse hepatic steatosis.
Subcapsular indistinct hypoechoic 2.1 x 1.8 x 2.1 cm focus adjacent
to the gallbladder, not definitely changed since [DATE] sonogram
where it measured approximately 2.3 x 1.4 cm, most compatible with
focal fatty sparing. No additional liver lesions demonstrated,
noting decreased sensitivity in the setting of an echogenic liver.
No definite liver surface irregularity. Portal vein is patent on
color Doppler imaging with normal direction of blood flow towards
the liver.

ULTRASOUND HEPATIC ELASTOGRAPHY

Device: Siemens Helix VTQ

Patient position: Supine

Transducer RASH

Number of measurements: 10

Hepatic segment:  8

Median kPa:

IQR:

IQR/Median kPa ratio:

Data quality:  Good

Diagnostic category: ?9 kPa: in the absence of other known clinical
signs, rules out cACLD
IMPRESSION: ULTRASOUND RUQ:

1. Diffuse hepatic steatosis. No definite morphologic changes of
cirrhosis.
2. Indistinct hypoechoic subcapsular liver focus adjacent to the
gallbladder, unchanged since [AG] sonogram, compatible with focal
fatty sparing. No liver masses, noting decreased sensitivity in the
setting of an echogenic liver.

ULTRASOUND HEPATIC ELASTOGRAPHY:

Median kPa:

Diagnostic category: ?9 kPa: in the absence of other known clinical
signs, rules out cACLD

The use of hepatic elastography is applicable to patients with viral
hepatitis and non-alcoholic fatty liver disease. At this time, there
is insufficient data for the referenced cut-off values and use in
other causes of liver disease, including alcoholic liver disease.
Patients, however, may be assessed by elastography and serve as
their own reference standard/baseline.

In patients with non-alcoholic liver disease, the values suggesting
compensated advanced chronic liver disease (cACLD) may be lower, and
patients may need additional testing with elasticity results of [DATE]
kPa.

Please note that abnormal hepatic elasticity and shear wave
velocities may also be identified in clinical settings other than
with hepatic fibrosis, such as: acute hepatitis, elevated right
heart and central venous pressures including use of beta blockers,
RASH disease (RASH), infiltrative processes such as
mastocytosis/amyloidosis/infiltrative tumor/lymphoma, extrahepatic
cholestasis, with hyperemia in the post-prandial state, and with
liver transplantation. Correlation with patient history, laboratory
data, and clinical condition recommended.

## 2020-09-26 ENCOUNTER — Other Ambulatory Visit (HOSPITAL_COMMUNITY): Payer: Self-pay | Admitting: Internal Medicine

## 2021-10-07 ENCOUNTER — Other Ambulatory Visit: Payer: Self-pay

## 2021-10-07 MED ORDER — COLCHICINE 0.6 MG PO TABS
ORAL_TABLET | ORAL | 0 refills | Status: DC
  Filled 2021-10-07: qty 3, 1d supply, fill #0

## 2021-10-08 ENCOUNTER — Other Ambulatory Visit: Payer: Self-pay

## 2021-10-08 MED ORDER — COLCHICINE 0.6 MG PO TABS
ORAL_TABLET | ORAL | 5 refills | Status: DC
  Filled 2021-10-08: qty 3, 1d supply, fill #0

## 2021-10-08 MED ORDER — SOLIFENACIN SUCCINATE 10 MG PO TABS
ORAL_TABLET | ORAL | 0 refills | Status: DC
  Filled 2021-10-08: qty 30, 30d supply, fill #0

## 2021-10-08 MED ORDER — ALLOPURINOL 300 MG PO TABS
300.0000 mg | ORAL_TABLET | Freq: Every day | ORAL | 0 refills | Status: DC
  Filled 2021-10-08: qty 30, 30d supply, fill #0

## 2021-10-25 ENCOUNTER — Telehealth: Payer: Self-pay | Admitting: Emergency Medicine

## 2021-10-25 ENCOUNTER — Other Ambulatory Visit: Payer: Self-pay

## 2021-10-25 ENCOUNTER — Encounter: Payer: Self-pay | Admitting: Emergency Medicine

## 2021-10-25 ENCOUNTER — Other Ambulatory Visit
Admission: RE | Admit: 2021-10-25 | Discharge: 2021-10-25 | Disposition: A | Discharge status: Home / Self Care | Payer: No Typology Code available for payment source | Source: Ambulatory Visit | Attending: Family Medicine | Admitting: Family Medicine

## 2021-10-25 ENCOUNTER — Ambulatory Visit
Admission: RE | Admit: 2021-10-25 | Discharge: 2021-10-25 | Disposition: A | Discharge status: Home / Self Care | Payer: No Typology Code available for payment source | Source: Ambulatory Visit | Attending: Student | Admitting: Student

## 2021-10-25 ENCOUNTER — Ambulatory Visit
Admission: EM | Admit: 2021-10-25 | Discharge: 2021-10-25 | Disposition: A | Discharge status: Home / Self Care | Payer: No Typology Code available for payment source | Attending: Student | Admitting: Student

## 2021-10-25 DIAGNOSIS — M109 Gout, unspecified: Secondary | ICD-10-CM | POA: Diagnosis present

## 2021-10-25 DIAGNOSIS — M10071 Idiopathic gout, right ankle and foot: Secondary | ICD-10-CM

## 2021-10-25 IMAGING — CR DG ANKLE COMPLETE 3+V*R*
1 series · 3 of 3 positions shown · non-contrast
Comparison: None.

CLINICAL DATA: Right ankle gout.

EXAM:
RIGHT ANKLE - COMPLETE 3+ VIEW

[Series 1: dg ankle complete right · 0.14mm/px · 3 of 3 slices shown]
[im 1/3]
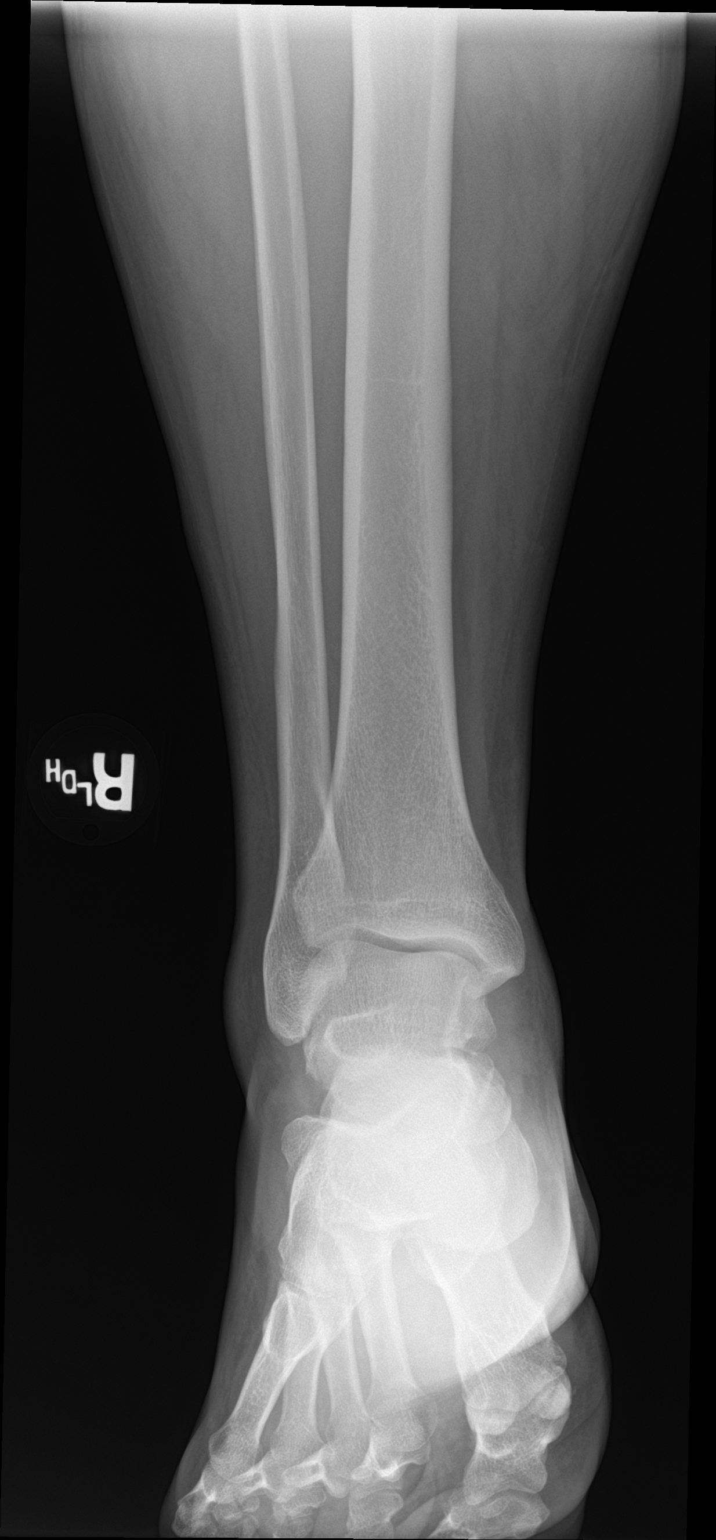
[im 2/3]
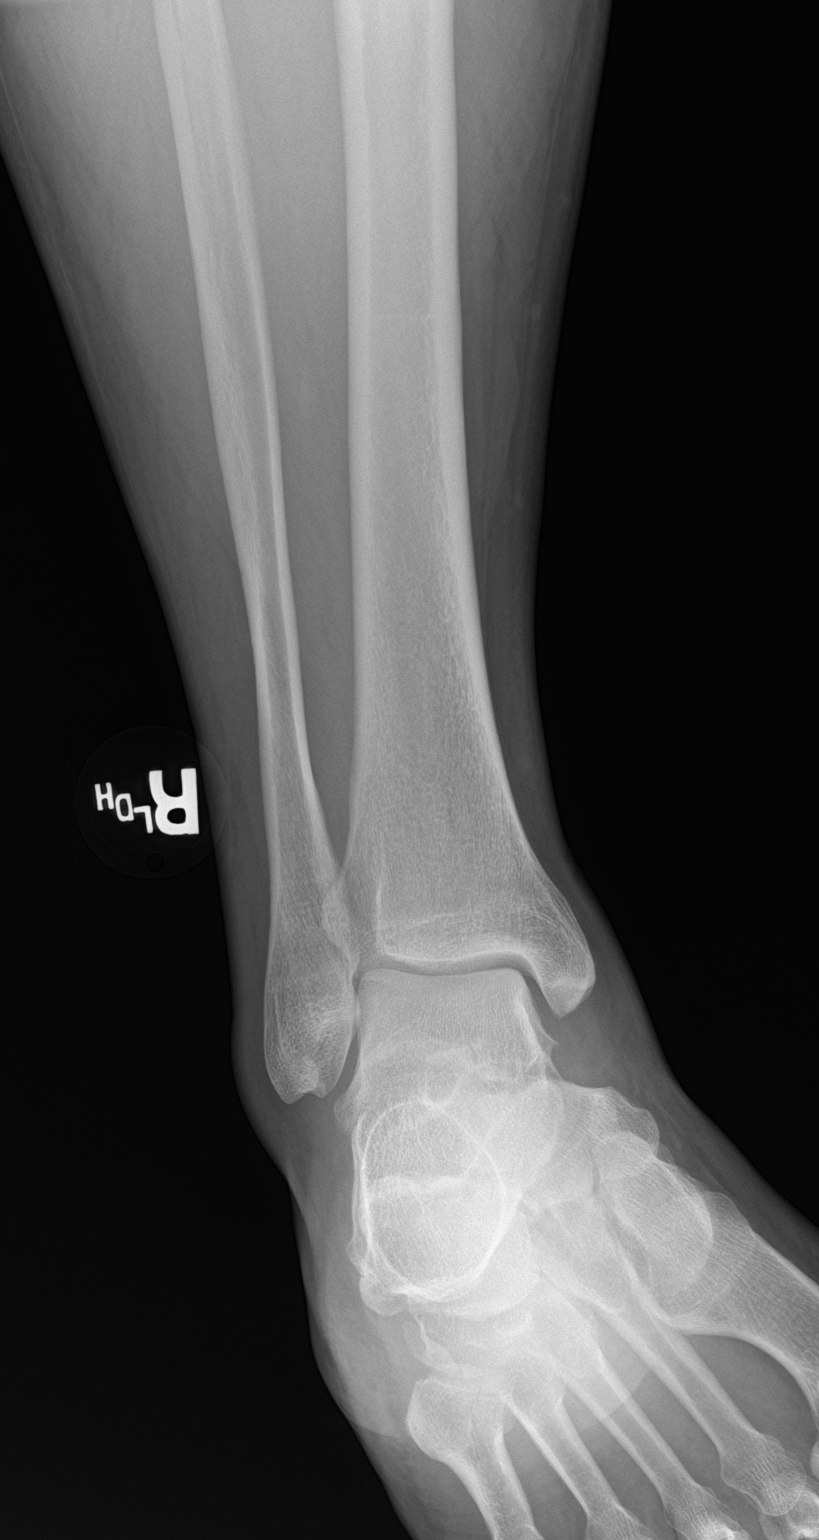
[im 3/3]
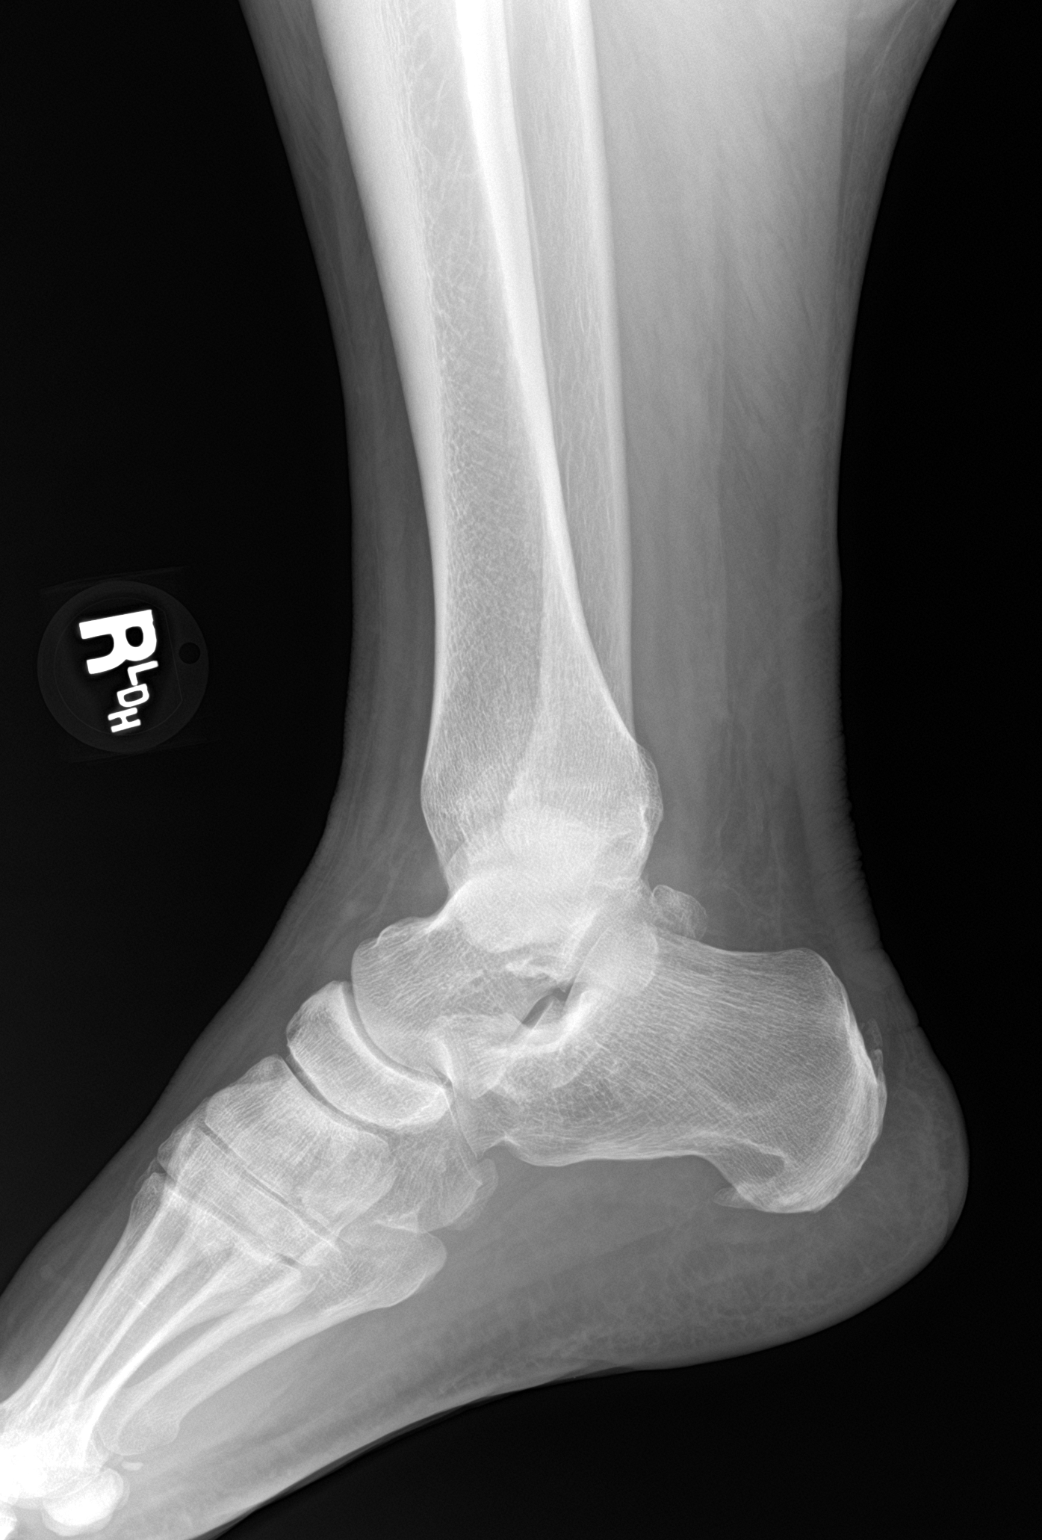

[3 of 3 positions shown; findings below may reference images not displayed]

FINDINGS: There is no evidence of fracture, dislocation, or joint effusion.
There is no evidence of arthropathy or other focal bone abnormality.
Soft tissues are unremarkable.
IMPRESSION: Negative.

## 2021-10-25 MED ORDER — PREDNISONE 20 MG PO TABS: 40.0000 mg | ORAL_TABLET | Freq: Every day | ORAL | 0 refills | Status: AC

## 2021-10-25 MED ORDER — PREDNISONE 20 MG PO TABS: 40.0000 mg | ORAL_TABLET | Freq: Every day | ORAL | 0 refills | Status: DC

## 2021-10-25 MED ORDER — METHYLPREDNISOLONE SODIUM SUCC 125 MG IJ SOLR
80.0000 mg | Freq: Once | INTRAMUSCULAR | Status: AC
  Administered 2021-10-25: 80 mg via INTRAMUSCULAR

## 2021-10-25 NOTE — ED Triage Notes (Signed)
Pt here for right ankle pain from gout x 1 month; improved with steroids but quickly returned  ?

## 2021-10-25 NOTE — ED Provider Notes (Signed)
?[facility] ? ? ? ?CSN: [id number] ?Arrival date & time: 10/25/21  1255 ? ? ?  ? ?History   ?Chief Complaint ?Chief Complaint  ?Patient presents with  ? Ankle Pain  ? ? ?HPI ?AADITH RAUDENBUSH is a 46 y.o. male presenting with right ankle gout for 1 month.  Initially improved with steroids but then returned.  States he has had gout for about 20 years, and this occurrence has been particularly persistent.  Has also attempted colchicine, and takes allopurinol daily.  Denies red meat, seafood, beer.  Denies trauma or overuse. ? ?HPI ? ?Past Medical History:  ?Diagnosis Date  ? Bell's palsy   ? Bell's palsy   ? Dental crowns present   ? Frozen shoulder 10/2011  ? right  ? Gout   ? Rotator cuff tear 10/2011  ? right  ? Shoulder impingement 10/2011  ? right  ? ? ?There are no problems to display for this patient. ? ? ?Past Surgical History:  ?Procedure Laterality Date  ? bone spur-right shoulder Right   ? NO PAST SURGERIES    ? ? ? ? ? ?Home Medications   ? ?Prior to Admission medications   ?Medication Sig Start Date End Date Taking? Authorizing Provider  ?predniSONE (DELTASONE) 20 MG tablet Take 2 tablets (40 mg total) by mouth daily for 5 days. Take with breakfast or lunch. Avoid NSAIDs (ibuprofen, etc) while taking this medication. 10/25/21 10/30/21 Yes Rhys Martini, PA-C  ?albuterol (VENTOLIN HFA) 108 (90 Base) MCG/ACT inhaler INHALE 1 PUFF BY MOUTH 4 TIMES DAILY AS NEEDED 09/26/20 09/26/21  Paulina Fusi, MD  ?allopurinol (ZYLOPRIM) 300 MG tablet Take 300 mg daily by mouth.    [provider]  ?allopurinol (ZYLOPRIM) 300 MG tablet Take 1 tablet by mouth daily 10/08/21     ?azelastine (ASTELIN) 0.1 % nasal spray USE 2 SPRAYS IN EACH NOSTRIL TWO TIMES DAILY FOR POST-NASAL DRIP 09/26/20 09/26/21  Paulina Fusi, MD  ?colchicine 0.6 MG tablet Take 0.6 mg daily by mouth.    [provider]  ?colchicine 0.6 MG tablet 1 (one) Tablet 2 now, then 1 after 3 hours. , for acute gout attacks 10/08/21      ?desloratadine (CLARINEX) 5 MG tablet TAKE 1 TABLET BY MOUTH DAILY 09/26/20 09/26/21  Paulina Fusi, MD  ?fluticasone [facility]) 50 MCG/ACT nasal spray USE 2 SPRAYS IN [facility] NOSTRIL DAILY 09/26/20 09/26/21  Paulina Fusi, MD  ?solifenacin (VESICARE) 10 MG tablet Take 1 tablet by mouth at bedtime, for urinary frequency and nocturia 10/08/21     ? ? ?Family History ?History reviewed. No pertinent family history. ? ?Social History ?Social History  ? ?Tobacco Use  ? Smoking status: Never  ? Smokeless tobacco: Never  ?Vaping Use  ? Vaping Use: Never used  ?Substance Use Topics  ? Alcohol use: No  ? Drug use: No  ? ? ? ?Allergies   ?Patient has no known allergies. ? ? ?Review of Systems ?Review of Systems  ?Musculoskeletal:   ?     R ankle pain   ?All other systems reviewed and are negative. ? ? ?Physical Exam ?Triage Vital Signs ?ED Triage Vitals  ?Enc Vitals Group  ?   BP 10/25/21 1352 136/87  ?   Pulse Rate 10/25/21 1352 73  ?   Resp 10/25/21 1352 18  ?   Temp 10/25/21 1352 98.3 ?F (36.8 ?C)  ?   Temp Source 10/25/21 1352 Oral  ?  SpO2 10/25/21 1352 97 %  ?   Weight --   ?   Height --   ?   Head Circumference --   ?   Peak Flow --   ?   Pain Score 10/25/21 1353 7  ?   Pain Loc --   ?   Pain Edu? --   ?   Excl. in GC? --   ? ?No data found. ? ?Updated Vital Signs ?BP 136/87 (BP Location: Left Arm)   Pulse 73   Temp 98.3 ?F (36.8 ?C) (Oral)   Resp 18   SpO2 97%  ? ?Visual Acuity ?Right Eye Distance:   ?Left Eye Distance:   ?Bilateral Distance:   ? ?Right Eye Near:   ?Left Eye Near:    ?Bilateral Near:    ? ?Physical Exam ?Vitals reviewed.  ?Constitutional:   ?   Appearance: Normal appearance.  ?Pulmonary:  ?   Effort: Pulmonary effort is normal.  ?Musculoskeletal:  ?   Comments: R ankle: diffusely TTP out of proportion to exam, without point tenderness. No obvious effusion, skin changes, or bony abnormality. No malleolar tenderness.  No midfoot tenderness, no metatarsal tenderness of deformity. Sensation intact.  ROM intact- plantarflexion, dorsiflexion, eversion, inversion. DP 2+, cap refill <2 seconds. No proximal tibial or fibular tenderness. Ambulating with pain.  ?Skin: ?   Capillary Refill: Capillary refill takes less than 2 seconds.  ?Neurological:  ?   General: No focal deficit present.  ?   Mental Status: He is alert and oriented to person, place, and time.  ?Psychiatric:     ?   Mood and Affect: Mood normal.     ?   Behavior: Behavior normal.     ?   Thought Content: Thought content normal.     ?   Judgment: Judgment normal.  ? ? ? ?UC Treatments / Results  ?Labs ?(all labs ordered are listed, but only abnormal results are displayed) ?Labs Reviewed - No data to display ? ?EKG ? ? ?Radiology ?No results found. ? ?Procedures ?Procedures (including critical care time) ? ?Medications Ordered in UC ?Medications  ?methylPREDNISolone sodium succinate (SOLU-MEDROL) 125 mg/2 mL injection 80 mg (80 mg Intramuscular Given 10/25/21 1414)  ? ? ?Initial Impression / Assessment and Plan / UC Course  ?I have reviewed the triage vital signs and the nursing notes. ? ?Pertinent labs & imaging results that were available during my care of the patient were reviewed by me and considered in my medical decision making (see chart for details). ? ?  ? ?This patient is a 46 y.o. year old male presenting with R ankle - gout. Neurovascularly intact, no trauma or overuse. Long history gout; already taking allopurinol daily and colchicine prn. Has also attempted prednisone during current flare.  Upon learning the limitations of urgent care, including her lack of onsite imaging today, patient became enraged, and expressed discontent with the fact that he paid for this visit and did not know that we could only send outpatient imaging and do a prednisone, colchicine, or NSAIDs for gout.  He initially declined all treatment, but upon reconsideration is amenable to prednisone.  IM Solu-Medrol administered additionally.  Outpatient imaging ordered, he  understands that results will likely not be delivered today, and he may have to return based on results.  He does have a primary care provider, and will follow with them in the future. ? ?Final Clinical Impressions(s) / UC Diagnoses  ? ?Final diagnoses:  ?Acute idiopathic gout  of right ankle  ? ? ? ?Discharge Instructions   ? ?  ?-Continue allopurinol ?-Follow-up with PCP  ?-Avoid red meat, seafood, beer ? ? ?ED Prescriptions   ? ? Medication Sig Dispense Auth. Provider  ? predniSONE (DELTASONE) 20 MG tablet Take 2 tablets (40 mg total) by mouth daily for 5 days. Take with breakfast or lunch. Avoid NSAIDs (ibuprofen, etc) while taking this medication. 10 tablet Rhys MartiniGraham, Geovanni Rahming E, PA-C  ? ?  ? ?PDMP not reviewed this encounter. ?  ?Rhys MartiniGraham, Lanaiya Lantry E, PA-C ?10/25/21 1425 ? ?

## 2021-10-25 NOTE — Discharge Instructions (Addendum)
-  Continue allopurinol ?-Follow-up with PCP  ?-Avoid red meat, seafood, beer ?

## 2021-10-26 ENCOUNTER — Other Ambulatory Visit: Payer: Self-pay

## 2021-10-26 MED ORDER — COLCHICINE 0.6 MG PO TABS
0.6000 mg | ORAL_TABLET | Freq: Every day | ORAL | 0 refills | Status: DC
  Filled 2021-10-26: qty 30, 30d supply, fill #0

## 2021-11-12 ENCOUNTER — Other Ambulatory Visit: Payer: Self-pay

## 2021-11-12 MED ORDER — PREDNISONE 10 MG PO TABS
ORAL_TABLET | ORAL | 0 refills | Status: DC
  Filled 2021-11-12: qty 37, 14d supply, fill #0

## 2021-12-01 ENCOUNTER — Other Ambulatory Visit: Payer: Self-pay

## 2021-12-01 MED ORDER — FEBUXOSTAT 40 MG PO TABS
ORAL_TABLET | ORAL | 1 refills | Status: DC
  Filled 2021-12-01: qty 30, 30d supply, fill #0
  Filled 2022-01-21: qty 30, 30d supply, fill #1

## 2021-12-07 ENCOUNTER — Other Ambulatory Visit: Payer: Self-pay

## 2021-12-07 MED ORDER — PREDNISONE 10 MG PO TABS
ORAL_TABLET | ORAL | 0 refills | Status: DC
  Filled 2021-12-07: qty 30, 30d supply, fill #0

## 2021-12-23 ENCOUNTER — Other Ambulatory Visit (HOSPITAL_COMMUNITY): Payer: Self-pay | Admitting: Orthopedic Surgery

## 2021-12-23 ENCOUNTER — Other Ambulatory Visit: Payer: Self-pay | Admitting: Orthopedic Surgery

## 2021-12-23 DIAGNOSIS — M79671 Pain in right foot: Secondary | ICD-10-CM

## 2021-12-28 ENCOUNTER — Ambulatory Visit (HOSPITAL_COMMUNITY): Payer: No Typology Code available for payment source

## 2021-12-28 ENCOUNTER — Encounter (HOSPITAL_COMMUNITY): Payer: Self-pay

## 2022-01-04 ENCOUNTER — Ambulatory Visit (HOSPITAL_COMMUNITY)
Admission: RE | Admit: 2022-01-04 | Discharge: 2022-01-04 | Disposition: A | Discharge status: Home / Self Care | Payer: No Typology Code available for payment source | Source: Ambulatory Visit | Attending: Orthopedic Surgery | Admitting: Orthopedic Surgery

## 2022-01-04 ENCOUNTER — Encounter (HOSPITAL_COMMUNITY): Payer: Self-pay | Admitting: Radiology

## 2022-01-04 DIAGNOSIS — M79671 Pain in right foot: Secondary | ICD-10-CM

## 2022-01-04 IMAGING — MR MR ANKLE*R* W/O CM
5 series · 37 of 40 positions shown · non-contrast
Comparison: Right ankle radiograph [DATE]

CLINICAL DATA: Recurring gout

EXAM:
MRI OF THE RIGHT ANKLE WITHOUT CONTRAST; MRI OF THE RIGHT FOREFOOT
WITHOUT CONTRAST
TECHNIQUE: Multiplanar, multisequence MR imaging of the ankle was performed. No
intravenous contrast was administered.

[Series 2: T2 fat-sat · axial · right · 4.0mm · 0.42mm/px · z∈[-115,+49]mm · 8 of 34 slices shown (1 of 2)]
[im 1/34]
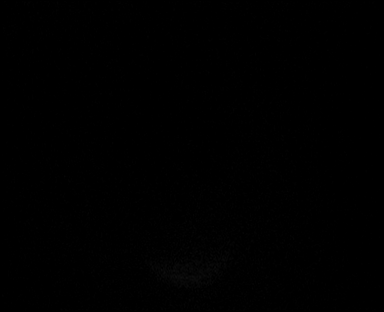
[im 5/34]
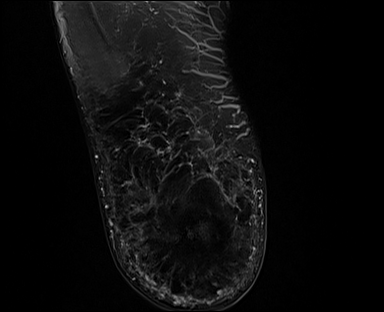
[im 10/34]
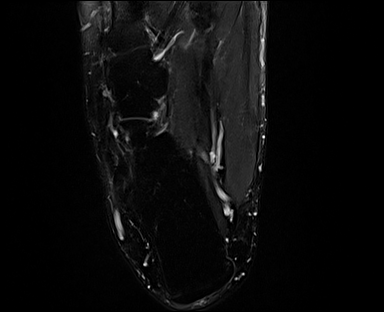
[im 15/34]
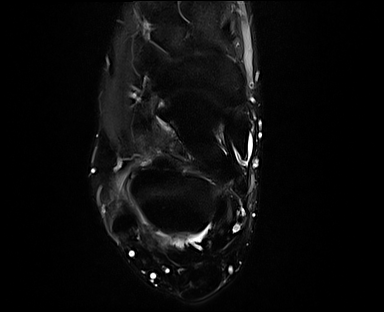
[im 19/34]
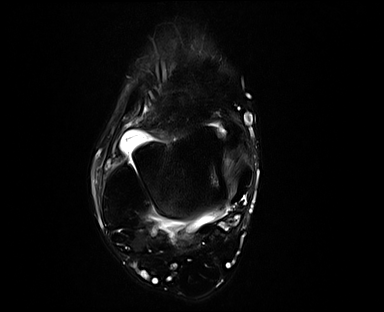
[im 24/34]
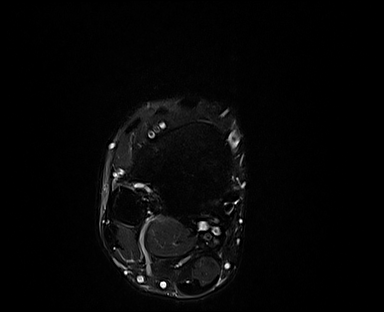
[im 29/34]
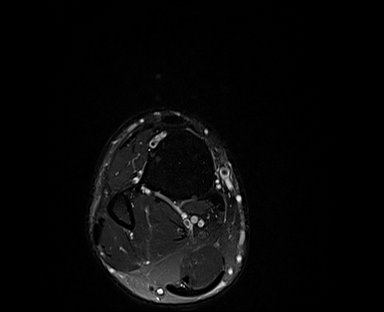
[im 34/34]
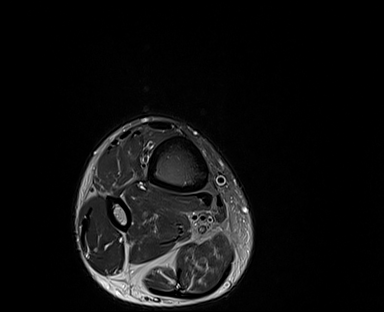

[Series 3: PD fat-sat · axial · right · 4.0mm · 0.50mm/px · z∈[-115,+49]mm · 9 of 34 slices shown]
[im 1/34]
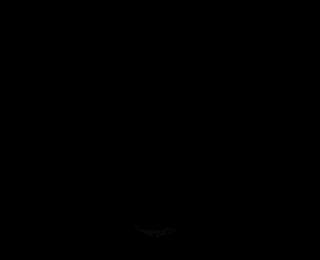
[im 5/34]
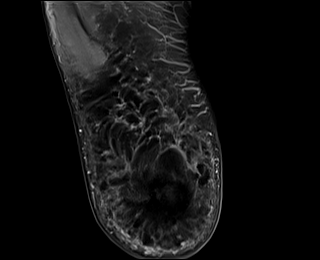
[im 9/34]
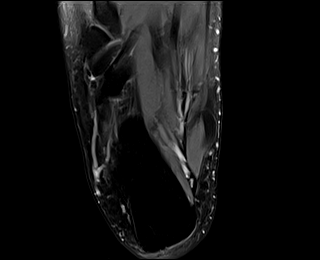
[im 13/34]
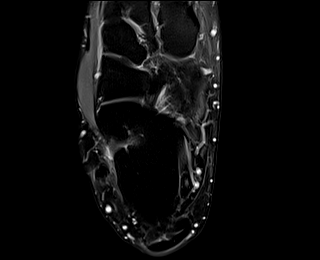
[im 17/34]
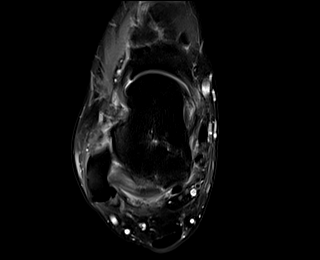
[im 21/34]
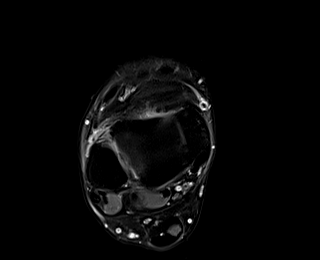
[im 25/34]
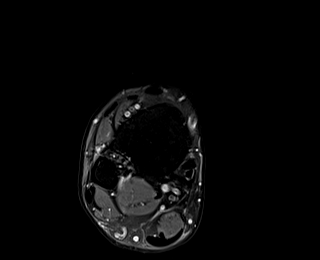
[im 29/34]
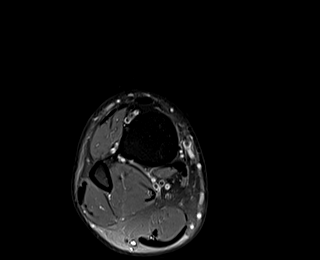
[im 34/34]
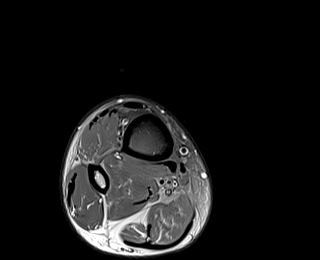

[Series 4: T2 fat-sat · coronal · right · 3.0mm · 0.50mm/px · 9 of 34 slices shown (2 of 2)]
[im 1/34]
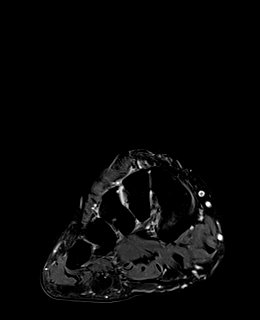
[im 5/34]
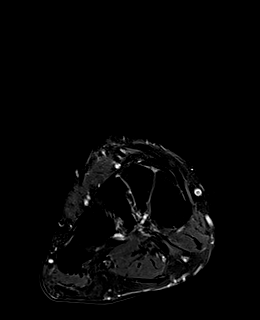
[im 9/34]
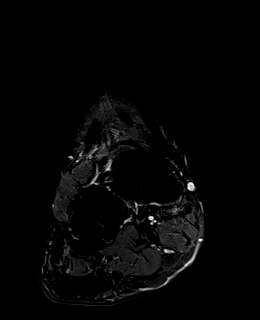
[im 13/34]
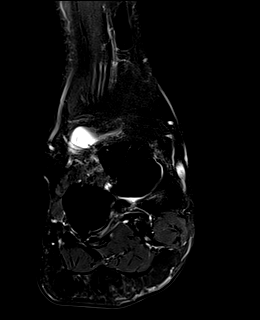
[im 17/34]
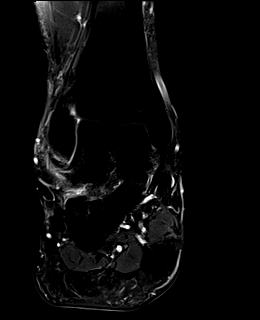
[im 21/34]
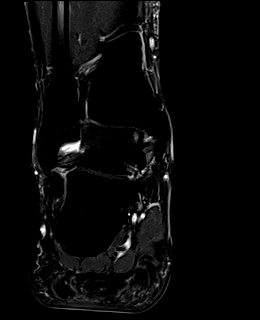
[im 25/34]
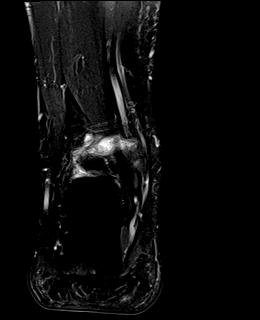
[im 29/34]
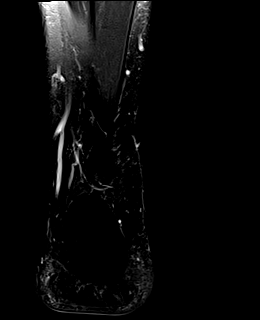
[im 34/34]
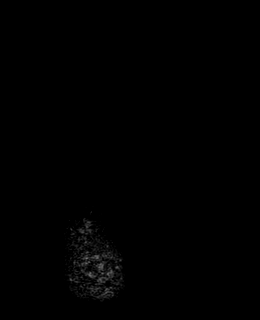

[Series 5: T1 · sagittal · right · 3.0mm · 0.50mm/px · 7 of 25 slices shown]
[im 1/25]
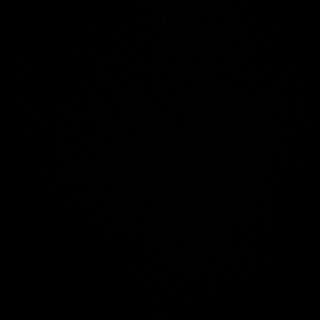
[im 5/25]
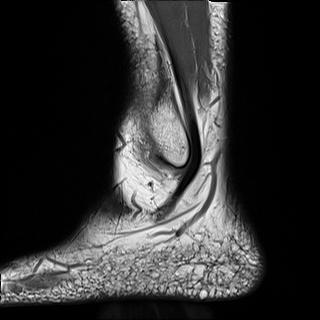
[im 9/25]
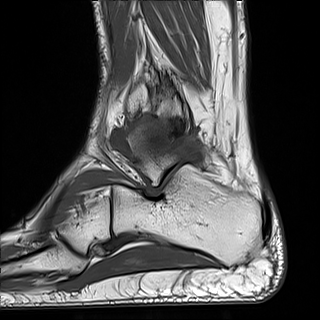
[im 13/25]
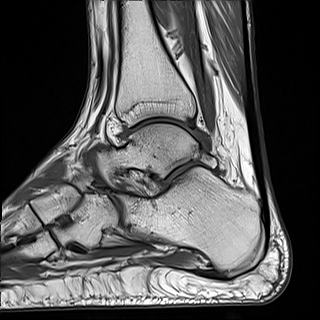
[im 17/25]
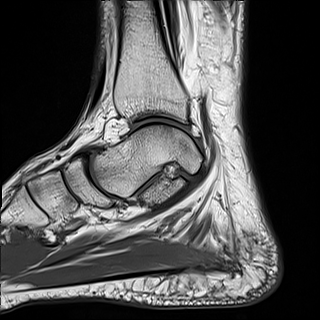
[im 21/25]
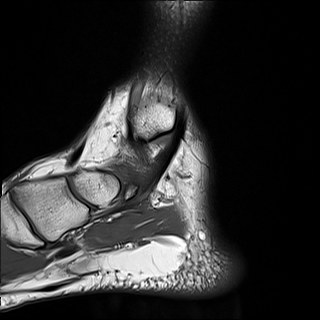
[im 25/25]
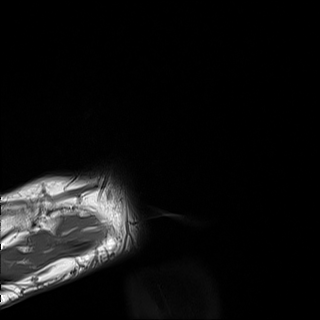

[Series 6: STIR · sagittal · right · 3.0mm · 0.31mm/px · 4 of 25 slices shown]
[im 1/25]
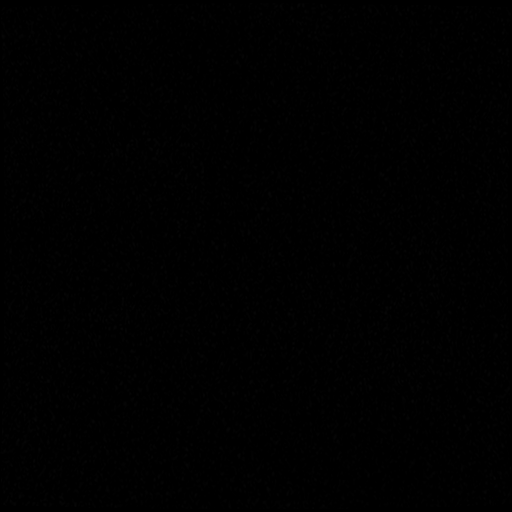
[im 5/25]
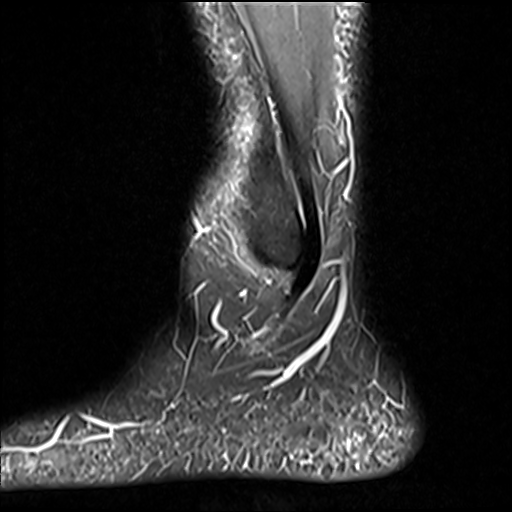
[im 9/25]
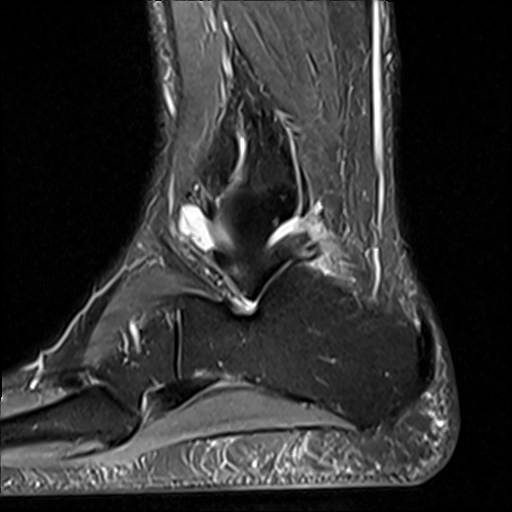
[im 13/25]
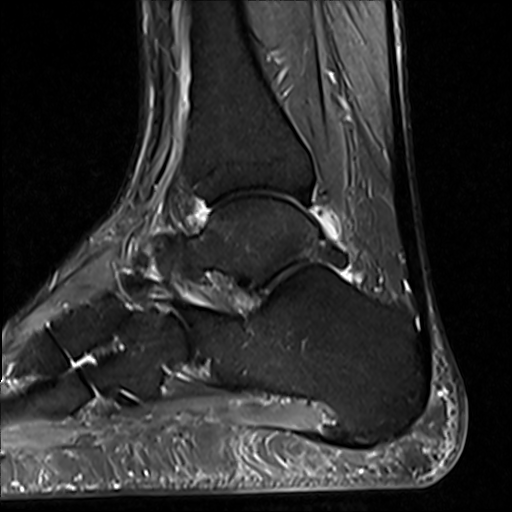

[37 of 40 positions shown; findings below may reference images not displayed]

FINDINGS: MR RIGHT ANKLE:

TENDONS

Peroneal: Intact peroneus longus and peroneus brevis tendons.

Posteromedial: Focal mild tenosynovitis of the infra malleolar
posterior tibial tendon. Intact flexor hallucis longus and flexor
digitorum longus tendons.

Anterior: Intact tibialis anterior, extensor hallucis longus and
extensor digitorum longus tendons.

Achilles: Intact.

Plantar Fascia: Intact plantar fascia. There is a small plantar
calcaneal spur.

LIGAMENTS

Lateral: There is periligamentous edema along the ATFL, and mild
irregularity suggesting low-grade partial tear. Calcaneofibular
ligament intact. Posterior talofibular ligament intact. Anterior and
posterior tibiofibular ligaments intact.

Medial: Deltoid ligament intact. Spring ligament intact.

CARTILAGE

Ankle Joint: Small tibiotalar joint effusion with heterogeneity
posteriorly. Focal subchondral marrow edema along the medial talar
dome. Intact subchondral bone plate without definite osteochondral
defect. There is mild partial-thickness cartilage loss of the
tibiotalar joint. There is a suspected erosion along the posterior
lateral aspect of the talus (sagittal T1 images tendon 11, axial T2
image 17)).

Subtalar Joints/Sinus Tarsi: Normal subtalar joints. No subtalar
joint effusion. Normal sinus tarsi. Prominent os trigonum.

Bones: There is midfoot bony spurring without significant marrow
signal alteration. Subchondral marrow edema along the medial talar
dome as noted above.

Soft Tissue: No additional findings.

MRI RIGHT FOREFOOT:

Bones/Joint/Cartilage

There is susceptibility artifact along the distal phalanx of the
great toe. There is moderate first MTP osteoarthritis with
subchondral cystic change. No definite bony erosions. There is no
significant joint effusion. There is no other significant marrow
signal alteration. There is no evidence of fracture. Alignment is
normal.

Ligaments

Intact MTP collateral ligaments.  No evidence of plantar plate tear.

Muscles and Tendons
No acute tendon tear or significant tenosynovitis. No significant
muscle edema or muscle atrophy.

Soft tissue
No focal fluid collection. No evidence of intermetatarsal neuroma or
bursitis.
IMPRESSION: MRI ankle:

Small tibiotalar joint effusion with synovitis and suspected erosion
along the posterolateral aspect of the talus. Findings can be seen
in crystalline or inflammatory arthropathy. This is superimposed on
mild tibiotalar osteoarthritis.

Mild focal tenosynovitis of the infra malleolar posterior tibial
tendon.

Low-grade partial tear of the anterior talofibular ligament.

MRI forefoot:

Moderate first MTP osteoarthritis. No evidence of inflammatory
arthritis in the forefoot.

## 2022-01-04 IMAGING — MR MR FOOT*R* W/O CM
5 series · 35 of 40 positions shown · non-contrast
Comparison: Right ankle radiograph [DATE]

CLINICAL DATA: Recurring gout

EXAM:
MRI OF THE RIGHT ANKLE WITHOUT CONTRAST; MRI OF THE RIGHT FOREFOOT
WITHOUT CONTRAST
TECHNIQUE: Multiplanar, multisequence MR imaging of the ankle was performed. No
intravenous contrast was administered.

[Series 4: T1 · coronal · right · 4.0mm · 0.47mm/px · 8 of 34 slices shown (1 of 2)]
[im 1/34]
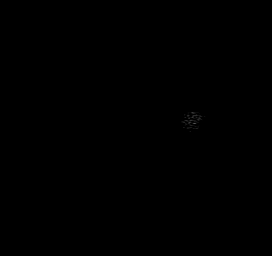
[im 4/34]
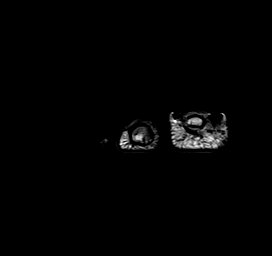
[im 12/34]
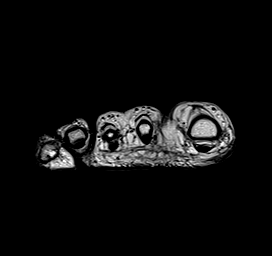
[im 15/34]
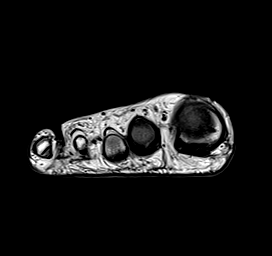
[im 19/34]
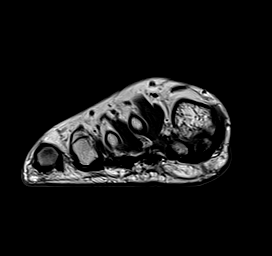
[im 23/34]
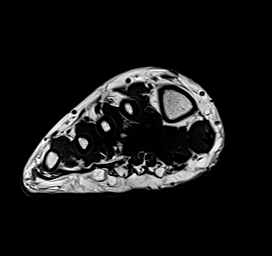
[im 30/34]
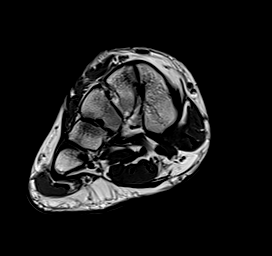
[im 34/34]
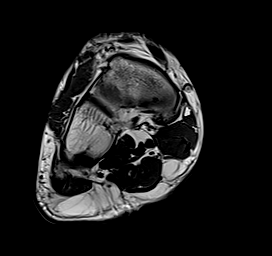

[Series 5: T2 fat-sat · axial · right · 3.0mm · 0.70mm/px · z∈[-150,-79]mm · 6 of 21 slices shown (1 of 2)]
[im 1/21]
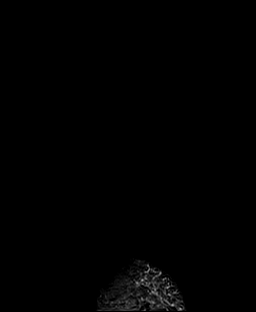
[im 5/21]
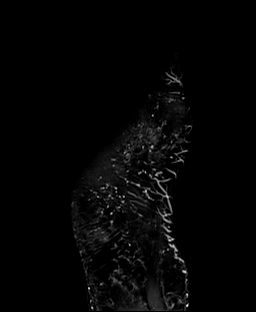
[im 9/21]
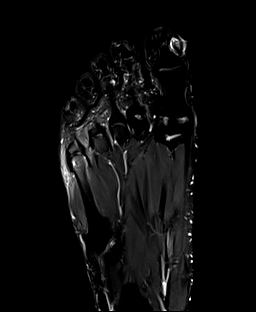
[im 13/21]
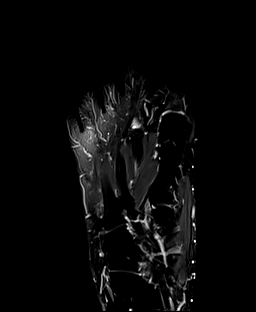
[im 17/21]
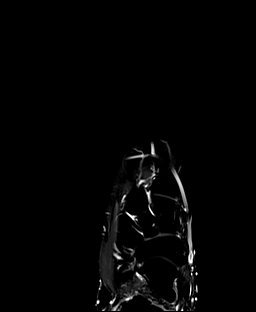
[im 21/21]
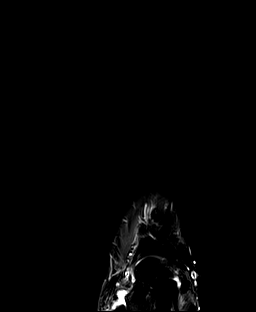

[Series 6: T1 · axial · right · 3.0mm · 0.70mm/px · z∈[-150,-79]mm · 6 of 22 slices shown (2 of 2)]
[im 1/22]
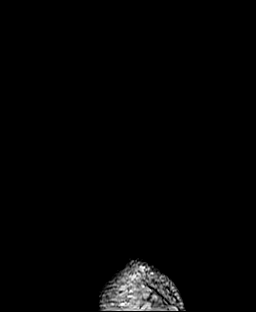
[im 5/22]
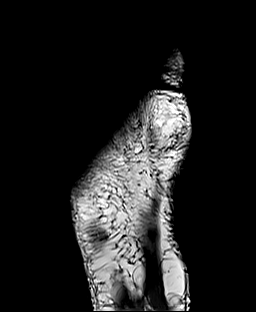
[im 9/22]
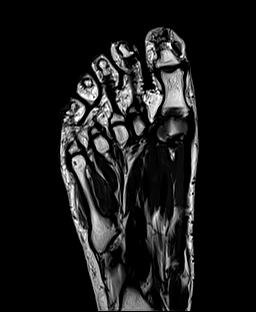
[im 13/22]
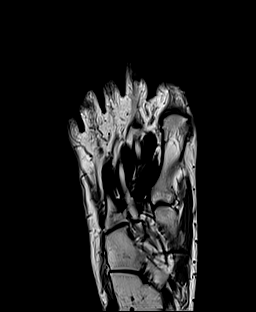
[im 17/22]
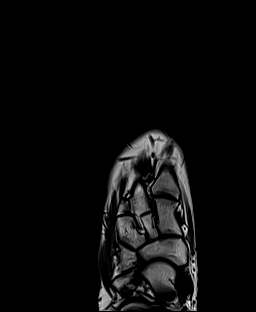
[im 22/22]
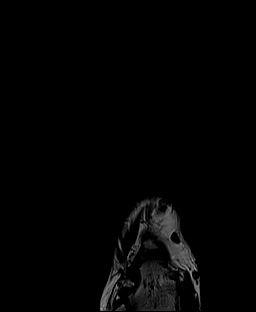

[Series 7: T2 fat-sat · coronal · right · 4.0mm · 0.38mm/px · 8 of 35 slices shown (2 of 2)]
[im 1/35]
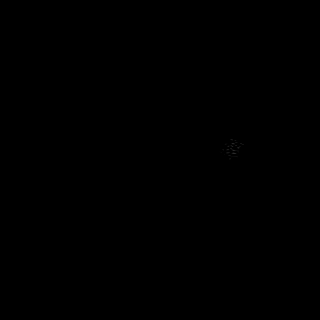
[im 4/35]
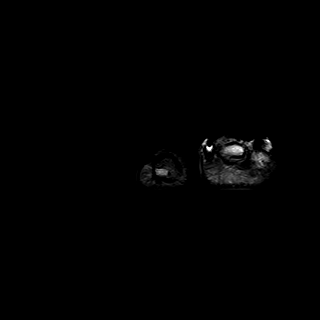
[im 12/35]
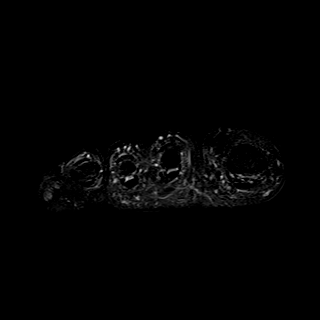
[im 16/35]
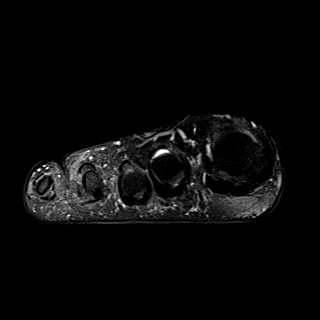
[im 19/35]
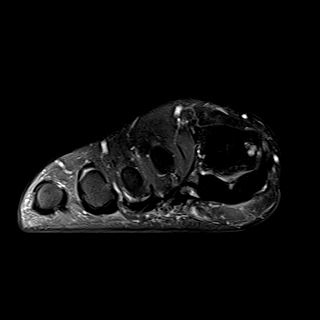
[im 23/35]
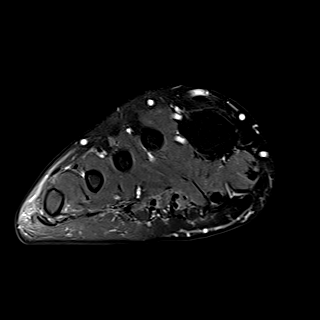
[im 31/35]
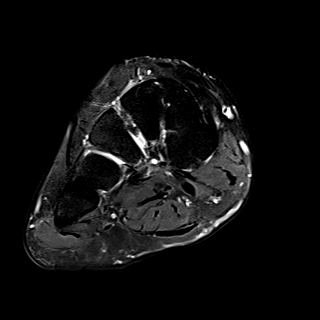
[im 35/35]
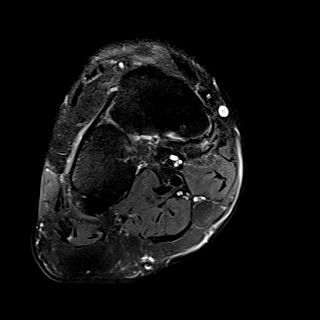

[Series 8: STIR · sagittal · right · 3.5mm · 0.35mm/px · 7 of 27 slices shown]
[im 1/27]
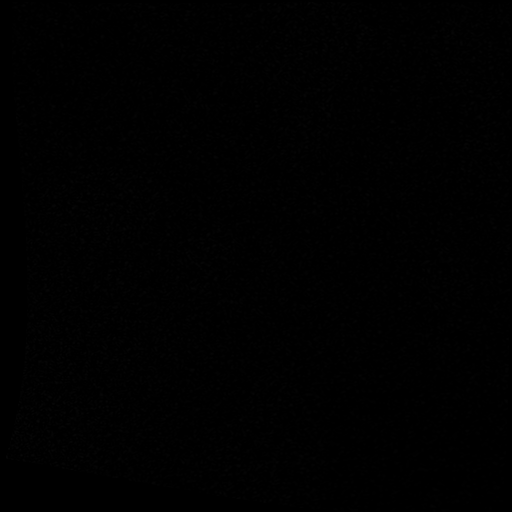
[im 4/27]
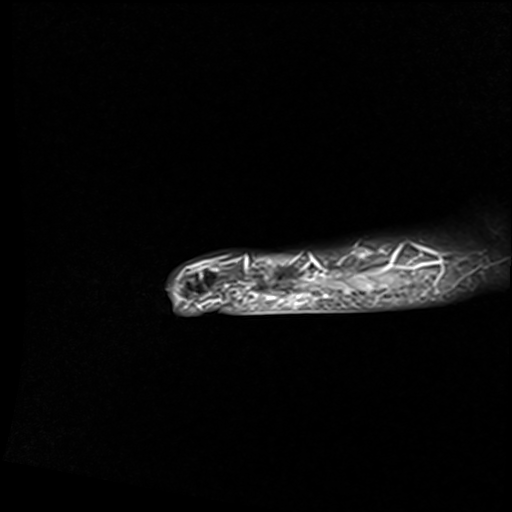
[im 8/27]
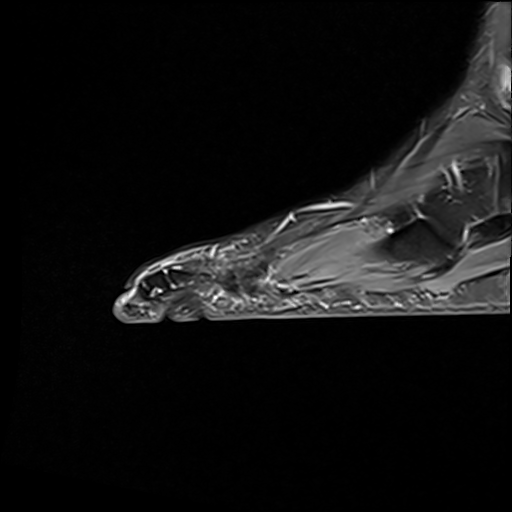
[im 12/27]
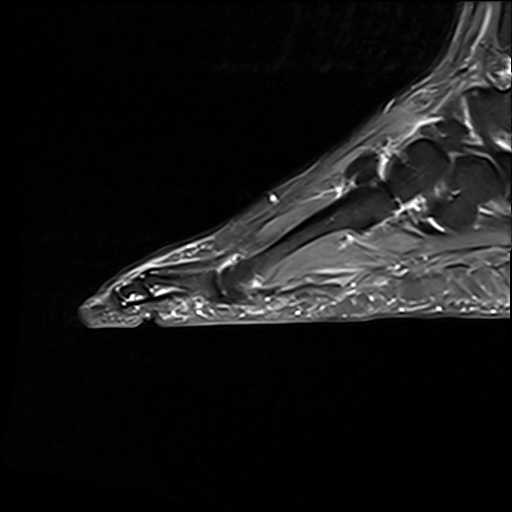
[im 15/27]
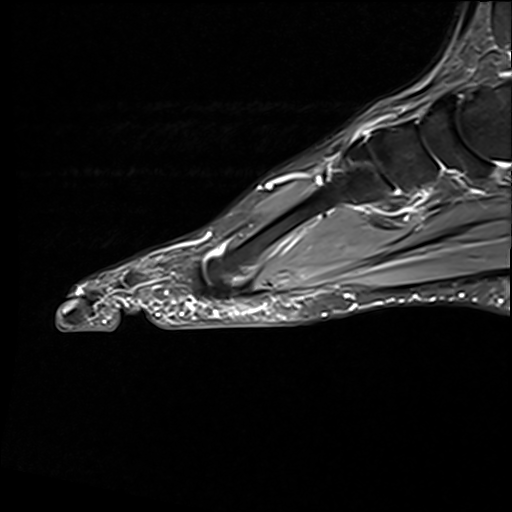
[im 19/27]
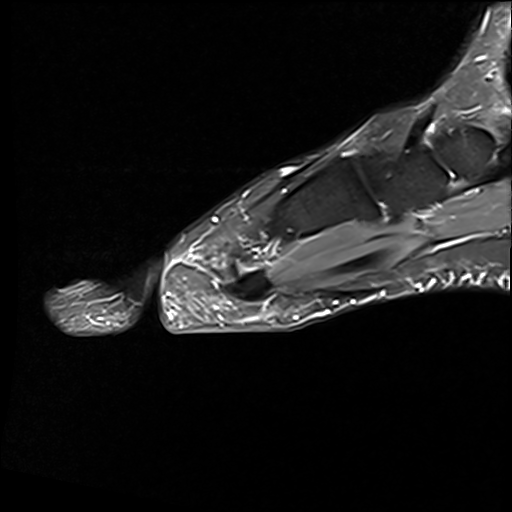
[im 23/27]
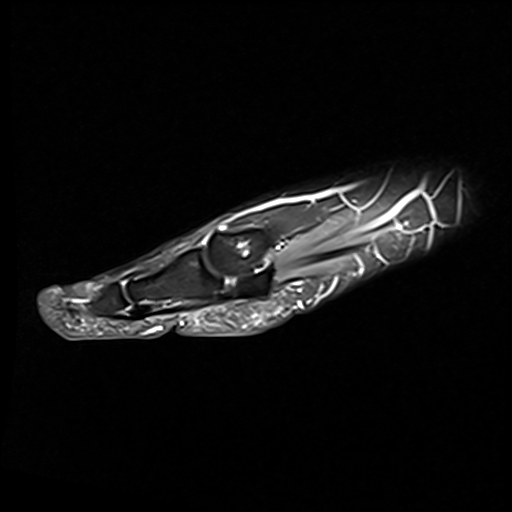

[35 of 40 positions shown; findings below may reference images not displayed]

FINDINGS: MR RIGHT ANKLE:

TENDONS

Peroneal: Intact peroneus longus and peroneus brevis tendons.

Posteromedial: Focal mild tenosynovitis of the infra malleolar
posterior tibial tendon. Intact flexor hallucis longus and flexor
digitorum longus tendons.

Anterior: Intact tibialis anterior, extensor hallucis longus and
extensor digitorum longus tendons.

Achilles: Intact.

Plantar Fascia: Intact plantar fascia. There is a small plantar
calcaneal spur.

LIGAMENTS

Lateral: There is periligamentous edema along the ATFL, and mild
irregularity suggesting low-grade partial tear. Calcaneofibular
ligament intact. Posterior talofibular ligament intact. Anterior and
posterior tibiofibular ligaments intact.

Medial: Deltoid ligament intact. Spring ligament intact.

CARTILAGE

Ankle Joint: Small tibiotalar joint effusion with heterogeneity
posteriorly. Focal subchondral marrow edema along the medial talar
dome. Intact subchondral bone plate without definite osteochondral
defect. There is mild partial-thickness cartilage loss of the
tibiotalar joint. There is a suspected erosion along the posterior
lateral aspect of the talus (sagittal T1 images tendon 11, axial T2
image 17)).

Subtalar Joints/Sinus Tarsi: Normal subtalar joints. No subtalar
joint effusion. Normal sinus tarsi. Prominent os trigonum.

Bones: There is midfoot bony spurring without significant marrow
signal alteration. Subchondral marrow edema along the medial talar
dome as noted above.

Soft Tissue: No additional findings.

MRI RIGHT FOREFOOT:

Bones/Joint/Cartilage

There is susceptibility artifact along the distal phalanx of the
great toe. There is moderate first MTP osteoarthritis with
subchondral cystic change. No definite bony erosions. There is no
significant joint effusion. There is no other significant marrow
signal alteration. There is no evidence of fracture. Alignment is
normal.

Ligaments

Intact MTP collateral ligaments.  No evidence of plantar plate tear.

Muscles and Tendons
No acute tendon tear or significant tenosynovitis. No significant
muscle edema or muscle atrophy.

Soft tissue
No focal fluid collection. No evidence of intermetatarsal neuroma or
bursitis.
IMPRESSION: MRI ankle:

Small tibiotalar joint effusion with synovitis and suspected erosion
along the posterolateral aspect of the talus. Findings can be seen
in crystalline or inflammatory arthropathy. This is superimposed on
mild tibiotalar osteoarthritis.

Mild focal tenosynovitis of the infra malleolar posterior tibial
tendon.

Low-grade partial tear of the anterior talofibular ligament.

MRI forefoot:

Moderate first MTP osteoarthritis. No evidence of inflammatory
arthritis in the forefoot.

## 2022-01-21 ENCOUNTER — Other Ambulatory Visit: Payer: Self-pay

## 2022-01-27 ENCOUNTER — Other Ambulatory Visit: Payer: Self-pay

## 2022-01-27 MED ORDER — COLCHICINE 0.6 MG PO TABS
ORAL_TABLET | ORAL | 1 refills | Status: DC
  Filled 2022-01-27: qty 30, 30d supply, fill #0
  Filled 2022-03-03: qty 30, 30d supply, fill #1

## 2022-03-03 ENCOUNTER — Other Ambulatory Visit: Payer: Self-pay

## 2022-03-03 MED ORDER — FEBUXOSTAT 40 MG PO TABS
ORAL_TABLET | ORAL | 1 refills | Status: DC
  Filled 2022-03-03: qty 30, 30d supply, fill #0
  Filled 2022-04-15: qty 30, 30d supply, fill #1

## 2022-04-15 ENCOUNTER — Other Ambulatory Visit: Payer: Self-pay

## 2022-04-15 MED ORDER — COLCHICINE 0.6 MG PO TABS
ORAL_TABLET | ORAL | 1 refills | Status: DC
  Filled 2022-04-15: qty 30, 30d supply, fill #0
  Filled 2022-05-26: qty 30, 30d supply, fill #1

## 2022-05-26 ENCOUNTER — Other Ambulatory Visit: Payer: Self-pay

## 2022-05-26 MED ORDER — FEBUXOSTAT 40 MG PO TABS
ORAL_TABLET | ORAL | 1 refills | Status: AC
  Filled 2022-05-26: qty 30, 30d supply, fill #0
  Filled 2022-08-25: qty 30, 30d supply, fill #1

## 2022-08-10 DIAGNOSIS — M79672 Pain in left foot: Secondary | ICD-10-CM | POA: Diagnosis not present

## 2022-08-10 DIAGNOSIS — M79671 Pain in right foot: Secondary | ICD-10-CM | POA: Diagnosis not present

## 2022-08-10 DIAGNOSIS — M25571 Pain in right ankle and joints of right foot: Secondary | ICD-10-CM | POA: Diagnosis not present

## 2022-08-10 DIAGNOSIS — M25579 Pain in unspecified ankle and joints of unspecified foot: Secondary | ICD-10-CM | POA: Diagnosis not present

## 2022-08-10 DIAGNOSIS — M109 Gout, unspecified: Secondary | ICD-10-CM | POA: Diagnosis not present

## 2022-08-10 DIAGNOSIS — M25572 Pain in left ankle and joints of left foot: Secondary | ICD-10-CM | POA: Diagnosis not present

## 2022-08-10 DIAGNOSIS — Z79899 Other long term (current) drug therapy: Secondary | ICD-10-CM | POA: Diagnosis not present

## 2022-08-10 DIAGNOSIS — M1990 Unspecified osteoarthritis, unspecified site: Secondary | ICD-10-CM | POA: Diagnosis not present

## 2022-08-25 ENCOUNTER — Other Ambulatory Visit: Payer: Self-pay

## 2022-08-25 MED ORDER — COLCHICINE 0.6 MG PO TABS
0.6000 mg | ORAL_TABLET | Freq: Every day | ORAL | 1 refills | Status: DC
  Filled 2022-08-25: qty 30, 30d supply, fill #0
  Filled 2022-10-14 (×2): qty 30, 30d supply, fill #1

## 2022-08-26 ENCOUNTER — Other Ambulatory Visit: Payer: Self-pay

## 2022-08-26 ENCOUNTER — Other Ambulatory Visit (HOSPITAL_COMMUNITY): Payer: Self-pay

## 2022-10-14 ENCOUNTER — Other Ambulatory Visit: Payer: Self-pay

## 2022-10-14 MED ORDER — FEBUXOSTAT 40 MG PO TABS
40.0000 mg | ORAL_TABLET | Freq: Every day | ORAL | 1 refills | Status: AC
  Filled 2022-10-14: qty 30, 30d supply, fill #0

## 2022-10-14 MED ORDER — FEBUXOSTAT 40 MG PO TABS
40.0000 mg | ORAL_TABLET | Freq: Every day | ORAL | 1 refills | Status: DC
  Filled 2022-10-14 – 2022-12-24 (×2): qty 30, 30d supply, fill #0
  Filled 2023-01-27: qty 30, 30d supply, fill #1

## 2022-10-14 MED ORDER — COLCHICINE 0.6 MG PO TABS
0.6000 mg | ORAL_TABLET | Freq: Every day | ORAL | 1 refills | Status: AC
  Filled 2022-10-14 – 2022-12-24 (×2): qty 30, 30d supply, fill #0

## 2022-11-09 DIAGNOSIS — M1990 Unspecified osteoarthritis, unspecified site: Secondary | ICD-10-CM | POA: Diagnosis not present

## 2022-11-09 DIAGNOSIS — M25579 Pain in unspecified ankle and joints of unspecified foot: Secondary | ICD-10-CM | POA: Diagnosis not present

## 2022-11-09 DIAGNOSIS — M109 Gout, unspecified: Secondary | ICD-10-CM | POA: Diagnosis not present

## 2022-11-09 DIAGNOSIS — Z79899 Other long term (current) drug therapy: Secondary | ICD-10-CM | POA: Diagnosis not present

## 2022-11-16 DIAGNOSIS — Z0000 Encounter for general adult medical examination without abnormal findings: Secondary | ICD-10-CM | POA: Diagnosis not present

## 2022-11-16 DIAGNOSIS — Z1331 Encounter for screening for depression: Secondary | ICD-10-CM | POA: Diagnosis not present

## 2022-12-24 ENCOUNTER — Other Ambulatory Visit: Payer: Self-pay

## 2023-01-27 ENCOUNTER — Other Ambulatory Visit: Payer: Self-pay

## 2023-01-27 MED ORDER — COLCHICINE 0.6 MG PO TABS
0.6000 mg | ORAL_TABLET | Freq: Every day | ORAL | 1 refills | Status: AC
  Filled 2023-01-27: qty 30, 30d supply, fill #0

## 2023-02-16 ENCOUNTER — Encounter: Payer: Self-pay | Admitting: Internal Medicine

## 2023-03-09 ENCOUNTER — Other Ambulatory Visit: Payer: Self-pay

## 2023-03-09 MED ORDER — FEBUXOSTAT 40 MG PO TABS
40.0000 mg | ORAL_TABLET | Freq: Every day | ORAL | 4 refills | Status: AC
  Filled 2023-03-09: qty 30, 30d supply, fill #0
  Filled 2023-04-26: qty 30, 30d supply, fill #1

## 2023-03-09 MED ORDER — COLCHICINE 0.6 MG PO TABS
0.6000 mg | ORAL_TABLET | Freq: Every day | ORAL | 1 refills | Status: AC
  Filled 2023-03-09: qty 30, 30d supply, fill #0
  Filled 2023-04-26: qty 30, 30d supply, fill #1

## 2023-03-10 ENCOUNTER — Other Ambulatory Visit: Payer: Self-pay

## 2023-03-16 DIAGNOSIS — M67912 Unspecified disorder of synovium and tendon, left shoulder: Secondary | ICD-10-CM | POA: Diagnosis not present

## 2023-03-16 DIAGNOSIS — M67911 Unspecified disorder of synovium and tendon, right shoulder: Secondary | ICD-10-CM | POA: Diagnosis not present

## 2023-03-22 ENCOUNTER — Encounter: Payer: 59 | Admitting: Internal Medicine

## 2023-04-11 ENCOUNTER — Other Ambulatory Visit: Payer: Self-pay

## 2023-04-11 ENCOUNTER — Ambulatory Visit (AMBULATORY_SURGERY_CENTER): Payer: 59

## 2023-04-11 VITALS — Ht 68.0 in | Wt 250.0 lb

## 2023-04-11 DIAGNOSIS — Z1211 Encounter for screening for malignant neoplasm of colon: Secondary | ICD-10-CM

## 2023-04-11 MED ORDER — NA SULFATE-K SULFATE-MG SULF 17.5-3.13-1.6 GM/177ML PO SOLN
1.0000 | Freq: Once | ORAL | 0 refills | Status: AC
  Filled 2023-04-11: qty 354, 1d supply, fill #0

## 2023-04-11 NOTE — Progress Notes (Signed)

## 2023-04-26 ENCOUNTER — Encounter: Payer: Self-pay | Admitting: Internal Medicine

## 2023-05-02 ENCOUNTER — Encounter: Payer: 59 | Admitting: Internal Medicine

## 2023-05-09 ENCOUNTER — Encounter: Payer: Self-pay | Admitting: Internal Medicine

## 2023-05-09 ENCOUNTER — Ambulatory Visit: Payer: 59 | Admitting: Internal Medicine

## 2023-05-09 VITALS — BP 124/79 | HR 78 | Temp 98.4°F | Resp 15 | Ht 68.0 in | Wt 250.0 lb

## 2023-05-09 DIAGNOSIS — D123 Benign neoplasm of transverse colon: Secondary | ICD-10-CM | POA: Diagnosis not present

## 2023-05-09 DIAGNOSIS — Z1211 Encounter for screening for malignant neoplasm of colon: Secondary | ICD-10-CM

## 2023-05-09 MED ORDER — SODIUM CHLORIDE 0.9 % IV SOLN: 500.0000 mL | INTRAVENOUS | Status: DC

## 2023-05-09 NOTE — Op Note (Signed)
[facility] Patient Name: Stephen Vega Procedure Date: 05/09/2023 1:43 PM MRN: [id number] Endoscopist: Beverley Fiedler , MD, [id number] Age: 47 Referring MD:  Date of Birth: 11-03-1975 Gender: Male Account #: [id number] Procedure:                Colonoscopy Indications:              Screening for colorectal malignant neoplasm (index                            screening) Medicines:                Monitored Anesthesia Care Procedure:                Pre-Anesthesia Assessment:                           - Prior to the procedure, a History and Physical                            was performed, and patient medications and                            allergies were reviewed. The patient's tolerance of                            previous anesthesia was also reviewed. The risks                            and benefits of the procedure and the sedation                            options and risks were discussed with the patient.                            All questions were answered, and informed consent                            was obtained. Prior Anticoagulants: The patient has                            taken no anticoagulant or antiplatelet agents. ASA                            Grade Assessment: I - A normal, healthy patient.                            After reviewing the risks and benefits, the patient                            was deemed in satisfactory condition to undergo the                            procedure.  After obtaining informed consent, the colonoscope                            was passed under direct vision. Throughout the                            procedure, the patient's blood pressure, pulse, and                            oxygen saturations were monitored continuously. The                            CF [id number] #[id number] was introduced through the anus                            and advanced to the cecum, identified by palpation.                             The colonoscopy was performed without difficulty.                            The patient tolerated the procedure well. The                            quality of the bowel preparation was excellent. The                            ileocecal valve, appendiceal orifice, and rectum                            were photographed. Scope In: 1:57:13 PM Scope Out: 2:06:40 PM Scope Withdrawal Time: 0 hours 8 minutes 18 seconds  Total Procedure Duration: 0 hours 9 minutes 27 seconds  Findings:                 The digital rectal exam was normal.                           A 5 mm polyp was found in the transverse colon. The                            polyp was sessile. The polyp was removed with a                            cold snare. Resection and retrieval were complete.                           Scattered small-mouthed diverticula were found in                            the sigmoid colon and descending colon.                           The exam was otherwise without abnormality on  direct and retroflexion views. Complications:            No immediate complications. Estimated Blood Loss:     Estimated blood loss: none. Impression:               - One 5 mm polyp in the transverse colon, removed                            with a cold snare. Resected and retrieved.                           - Mild diverticulosis in the sigmoid colon and in                            the descending colon.                           - The examination was otherwise normal on direct                            and retroflexion views. Recommendation:           - Patient has a contact number available for                            emergencies. The signs and symptoms of potential                            delayed complications were discussed with the                            patient. Return to normal activities tomorrow.                            Written discharge instructions  were provided to the                            patient.                           - Resume previous diet.                           - Continue present medications.                           - Await pathology results.                           - Repeat colonoscopy is recommended. The                            colonoscopy date will be determined after pathology                            results from today's exam become available for  review. Beverley Fiedler, MD 05/09/2023 2:09:12 PM This report has been signed electronically.

## 2023-05-09 NOTE — Patient Instructions (Signed)
Thank you for letting us take care of your healthcare needs today. Please see handouts given to you on Polyps and Diverticulosis.    YOU HAD AN ENDOSCOPIC PROCEDURE TODAY AT [facility]:   Refer to the procedure report that was given to you for any specific questions about what was found during the examination.  If the procedure report does not answer your questions, please call your gastroenterologist to clarify.  If you requested that your care partner not be given the details of your procedure findings, then the procedure report has been included in a sealed envelope for you to review at your convenience later.  YOU SHOULD EXPECT: Some feelings of bloating in the abdomen. Passage of more gas than usual.  Walking can help get rid of the air that was put into your GI tract during the procedure and reduce the bloating. If you had a lower endoscopy (such as a colonoscopy or flexible sigmoidoscopy) you may notice spotting of blood in your stool or on the toilet paper. If you underwent a bowel prep for your procedure, you may not have a normal bowel movement for a few days.  Please Note:  You might notice some irritation and congestion in your nose or some drainage.  This is from the oxygen used during your procedure.  There is no need for concern and it should clear up in a day or so.  SYMPTOMS TO REPORT IMMEDIATELY:  Following lower endoscopy (colonoscopy or flexible sigmoidoscopy):  Excessive amounts of blood in the stool  Significant tenderness or worsening of abdominal pains  Swelling of the abdomen that is new, acute  Fever of 100F or higher   For urgent or emergent issues, a gastroenterologist can be reached at any hour by calling [phone]. Do not use MyChart messaging for urgent concerns.    DIET:  We do recommend a small meal at first, but then you may proceed to your regular diet.  Drink plenty of fluids but you should avoid alcoholic beverages for 24  hours.  ACTIVITY:  You should plan to take it easy for the rest of today and you should NOT DRIVE or use heavy machinery until tomorrow (because of the sedation medicines used during the test).    FOLLOW UP: Our staff will call the number listed on your records the next business day following your procedure.  We will call around 7:15- 8:00 am to check on you and address any questions or concerns that you may have regarding the information given to you following your procedure. If we do not reach you, we will leave a message.     If any biopsies were taken you will be contacted by phone or by letter within the next 1-3 weeks.  Please call us at [phone] if you have not heard about the biopsies in 3 weeks.    SIGNATURES/CONFIDENTIALITY: You and/or your care partner have signed paperwork which will be entered into your electronic medical record.  These signatures attest to the fact that that the information above on your After Visit Summary has been reviewed and is understood.  Full responsibility of the confidentiality of this discharge information lies with you and/or your care-partner.

## 2023-05-09 NOTE — Progress Notes (Signed)
Sedate, gd SR, tolerated procedure well, VSS, report to RN 

## 2023-05-09 NOTE — Progress Notes (Signed)
GASTROENTEROLOGY PROCEDURE H&P NOTE   Primary Care Physician: [facility]    Reason for Procedure:  Colon cancer screening  Plan:    Colonoscopy  Patient is appropriate for endoscopic procedure(s) in the ambulatory ([facility]) setting.  The nature of the procedure, as well as the risks, benefits, and alternatives were carefully and thoroughly reviewed with the patient. Ample time for discussion and questions allowed. The patient understood, was satisfied, and agreed to proceed.     HPI: Stephen Vega is a 47 y.o. male who presents for screening colonoscopy.  Medical history as below.  Tolerated the prep.  No recent chest pain or shortness of breath.  No abdominal pain today.  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 Authorizing Provider  colchicine 0.6 MG tablet Take 1 tablet (0.6 mg total) by mouth daily. 03/09/23  Yes   febuxostat (ULORIC) 40 MG tablet Take 1 tablet (40 mg total) by mouth daily. 03/09/23  Yes   colchicine 0.6 MG tablet Take 1 tablet (0.6 mg total) by mouth daily. Patient not taking: Reported on 05/09/2023 10/14/22     colchicine 0.6 MG tablet Take 1 tablet (0.6 mg total) by mouth daily. Patient not taking: Reported on 05/09/2023 01/27/23     febuxostat (ULORIC) 40 MG tablet take 1 tablet by mouth once a day Patient not taking: Reported on 05/09/2023 05/26/22     febuxostat (ULORIC) 40 MG tablet Take 1 tablet (40 mg total) by mouth daily. Patient not taking: Reported on 05/09/2023 10/14/22     fluticasone (FLONASE) 50 MCG/ACT nasal spray USE 2 SPRAYS IN [facility] NOSTRIL DAILY Patient not  taking: Reported on 04/11/2023 09/26/20 09/26/21  Paulina Fusi, MD    Current Outpatient Medications  Medication Sig Dispense Refill   colchicine 0.6 MG tablet Take 1 tablet (0.6 mg total) by mouth daily. 30 tablet 1   febuxostat (ULORIC) 40 MG tablet Take 1 tablet (40 mg total) by mouth daily. 30 tablet 4   colchicine 0.6 MG tablet Take 1 tablet (0.6 mg total) by mouth daily. (Patient not taking: Reported on 05/09/2023) 30 tablet 1   colchicine 0.6 MG tablet Take 1 tablet (0.6 mg total) by mouth daily. (Patient not taking: Reported on 05/09/2023) 30 tablet 1   febuxostat (ULORIC) 40 MG tablet take 1 tablet by mouth once a day (Patient not taking: Reported on 05/09/2023) 30 tablet 1   febuxostat (ULORIC) 40 MG tablet Take 1 tablet (40 mg total) by mouth daily. (Patient not taking: Reported on 05/09/2023) 30 tablet 1   fluticasone (FLONASE) 50 MCG/ACT nasal spray USE 2 SPRAYS IN EACH NOSTRIL DAILY (Patient not taking: Reported on 04/11/2023) 16 g 5   Current Facility-Administered Medications  Medication Dose Route Frequency Provider Last Rate Last Admin   0.9 %  sodium chloride infusion  500 mL Intravenous Continuous Deshae Dickison, Carie Caddy, MD        Allergies as of 05/09/2023   (No Known Allergies)    Family History  Problem  Relation Age of Onset   Colon cancer Neg Hx    Rectal cancer Neg Hx    Stomach cancer Neg Hx     Social History   Socioeconomic History   Marital status: Married    Spouse name: Not on file   Number of children: Not on file   Years of education: Not on file   Highest education level: Not on file  Occupational History   Not on file  Tobacco Use   Smoking status: Never   Smokeless tobacco: Never  Vaping Use   Vaping status: Never Used  Substance and Sexual Activity   Alcohol use: No   Drug use: No   Sexual activity: Not on file  Other Topics Concern   Not on file  Social History Narrative   Not on file   Social Determinants of Health   Financial  Resource Strain: Not on file  Food Insecurity: Not on file  Transportation Needs: Not on file  Physical Activity: Not on file  Stress: Not on file  Social Connections: Not on file  Intimate Partner Violence: Not on file    Physical Exam: Vital signs in last 24 hours: @BP  (!) 157/85   Pulse 78   Temp 98.4 F (36.9 C)   Resp 12   Ht 5\' 8"  (1.727 m)   Wt 250 lb (113.4 kg)   SpO2 98%   BMI 38.01 kg/m  GEN: NAD EYE: Sclerae anicteric ENT: MMM CV: Non-tachycardic Pulm: CTA b/l GI: Soft, NT/ND NEURO:  Alert & Oriented x 3   Erick Blinks, MD  Gastroenterology  05/09/2023 1:50 PM

## 2023-05-09 NOTE — Progress Notes (Signed)
Called to room to assist during endoscopic procedure.  Patient ID and intended procedure confirmed with present staff. Received instructions for my participation in the procedure from the performing physician.  

## 2023-05-10 ENCOUNTER — Telehealth: Payer: Self-pay

## 2023-05-10 NOTE — Telephone Encounter (Signed)
  Follow up Call-     05/09/2023    1:16 PM  Call back number  Post procedure Call Back phone  # [phone]  Permission to leave phone message Yes     Patient questions:  Do you have a fever, pain , or abdominal swelling? No. Pain Score  0 *  Have you tolerated food without any problems? Yes.    Have you been able to return to your normal activities? Yes.    Do you have any questions about your discharge instructions: Diet   No. Medications  No. Follow up visit  No.  Do you have questions or concerns about your Care? No.  Actions: * If pain score is 4 or above: No action needed, pain <4.

## 2023-05-12 ENCOUNTER — Encounter: Payer: Self-pay | Admitting: Internal Medicine

## 2023-05-12 LAB — SURGICAL PATHOLOGY

## 2023-06-08 ENCOUNTER — Other Ambulatory Visit: Payer: Self-pay

## 2023-06-08 MED ORDER — FEBUXOSTAT 40 MG PO TABS
40.0000 mg | ORAL_TABLET | Freq: Every day | ORAL | 0 refills | Status: AC
  Filled 2023-06-08: qty 30, 30d supply, fill #0

## 2023-06-08 MED ORDER — COLCHICINE 0.6 MG PO TABS
0.6000 mg | ORAL_TABLET | Freq: Every day | ORAL | 0 refills | Status: AC
  Filled 2023-06-08: qty 30, 30d supply, fill #0

## 2023-07-11 ENCOUNTER — Other Ambulatory Visit: Payer: Self-pay

## 2023-07-11 MED ORDER — COLCHICINE 0.6 MG PO TABS
0.6000 mg | ORAL_TABLET | Freq: Every day | ORAL | 0 refills | Status: DC
  Filled 2023-07-11: qty 30, 30d supply, fill #0

## 2023-07-11 MED ORDER — FEBUXOSTAT 40 MG PO TABS
40.0000 mg | ORAL_TABLET | Freq: Every day | ORAL | 0 refills | Status: DC
  Filled 2023-07-11: qty 30, 30d supply, fill #0

## 2023-07-18 ENCOUNTER — Other Ambulatory Visit: Payer: Self-pay

## 2023-07-18 DIAGNOSIS — J069 Acute upper respiratory infection, unspecified: Secondary | ICD-10-CM | POA: Diagnosis not present

## 2023-07-18 MED ORDER — PREDNISONE 10 MG PO TABS
ORAL_TABLET | ORAL | 0 refills | Status: AC
  Filled 2023-07-18: qty 42, 12d supply, fill #0

## 2023-09-02 ENCOUNTER — Other Ambulatory Visit: Payer: Self-pay

## 2023-09-02 MED ORDER — FEBUXOSTAT 40 MG PO TABS
40.0000 mg | ORAL_TABLET | Freq: Every day | ORAL | 0 refills | Status: AC
  Filled 2023-09-02: qty 30, 30d supply, fill #0

## 2023-09-02 MED ORDER — COLCHICINE 0.6 MG PO TABS
0.6000 mg | ORAL_TABLET | Freq: Every day | ORAL | 0 refills | Status: AC
  Filled 2023-09-02: qty 30, 30d supply, fill #0

## 2023-10-04 ENCOUNTER — Other Ambulatory Visit: Payer: Self-pay

## 2023-10-04 DIAGNOSIS — M1990 Unspecified osteoarthritis, unspecified site: Secondary | ICD-10-CM | POA: Diagnosis not present

## 2023-10-04 DIAGNOSIS — Z79899 Other long term (current) drug therapy: Secondary | ICD-10-CM | POA: Diagnosis not present

## 2023-10-04 DIAGNOSIS — K7581 Nonalcoholic steatohepatitis (NASH): Secondary | ICD-10-CM | POA: Diagnosis not present

## 2023-10-04 DIAGNOSIS — M109 Gout, unspecified: Secondary | ICD-10-CM | POA: Diagnosis not present

## 2023-10-04 MED ORDER — COLCHICINE 0.6 MG PO TABS
0.6000 mg | ORAL_TABLET | Freq: Every day | ORAL | 0 refills | Status: DC
  Filled 2023-10-04 – 2024-01-31 (×2): qty 30, 30d supply, fill #0

## 2023-10-04 MED ORDER — FEBUXOSTAT 40 MG PO TABS
40.0000 mg | ORAL_TABLET | Freq: Every day | ORAL | 6 refills | Status: AC
  Filled 2023-10-04 – 2023-11-28 (×2): qty 30, 30d supply, fill #0
  Filled 2024-05-12: qty 30, 30d supply, fill #1

## 2023-10-14 ENCOUNTER — Other Ambulatory Visit: Payer: Self-pay

## 2023-11-28 ENCOUNTER — Other Ambulatory Visit: Payer: Self-pay

## 2023-11-28 DIAGNOSIS — H43393 Other vitreous opacities, bilateral: Secondary | ICD-10-CM | POA: Diagnosis not present

## 2023-11-28 DIAGNOSIS — D3132 Benign neoplasm of left choroid: Secondary | ICD-10-CM | POA: Diagnosis not present

## 2023-11-28 MED ORDER — FEBUXOSTAT 40 MG PO TABS
40.0000 mg | ORAL_TABLET | Freq: Every day | ORAL | 6 refills | Status: AC
  Filled 2023-11-28 – 2024-01-31 (×2): qty 30, 30d supply, fill #0
  Filled 2024-03-06: qty 30, 30d supply, fill #1
  Filled 2024-04-01: qty 30, 30d supply, fill #2
  Filled 2024-08-08: qty 30, 30d supply, fill #3

## 2024-01-31 ENCOUNTER — Other Ambulatory Visit (HOSPITAL_BASED_OUTPATIENT_CLINIC_OR_DEPARTMENT_OTHER): Payer: Self-pay

## 2024-02-28 ENCOUNTER — Other Ambulatory Visit (HOSPITAL_BASED_OUTPATIENT_CLINIC_OR_DEPARTMENT_OTHER): Payer: Self-pay

## 2024-02-28 DIAGNOSIS — R21 Rash and other nonspecific skin eruption: Secondary | ICD-10-CM | POA: Diagnosis not present

## 2024-02-28 MED ORDER — MUPIROCIN 2 % EX OINT
TOPICAL_OINTMENT | Freq: Two times a day (BID) | CUTANEOUS | 1 refills | Status: AC
  Filled 2024-02-28: qty 22, 11d supply, fill #0
  Filled 2024-04-01: qty 22, 11d supply, fill #1

## 2024-02-28 MED ORDER — DOXYCYCLINE HYCLATE 100 MG PO TABS
100.0000 mg | ORAL_TABLET | Freq: Two times a day (BID) | ORAL | 0 refills | Status: AC
  Filled 2024-02-28: qty 20, 10d supply, fill #0

## 2024-03-06 ENCOUNTER — Other Ambulatory Visit (HOSPITAL_BASED_OUTPATIENT_CLINIC_OR_DEPARTMENT_OTHER): Payer: Self-pay

## 2024-03-07 ENCOUNTER — Other Ambulatory Visit (HOSPITAL_BASED_OUTPATIENT_CLINIC_OR_DEPARTMENT_OTHER): Payer: Self-pay

## 2024-03-07 MED ORDER — COLCHICINE 0.6 MG PO TABS
0.6000 mg | ORAL_TABLET | Freq: Every day | ORAL | 0 refills | Status: DC
  Filled 2024-03-07: qty 30, 30d supply, fill #0

## 2024-04-01 ENCOUNTER — Other Ambulatory Visit (HOSPITAL_BASED_OUTPATIENT_CLINIC_OR_DEPARTMENT_OTHER): Payer: Self-pay

## 2024-04-02 ENCOUNTER — Other Ambulatory Visit (HOSPITAL_BASED_OUTPATIENT_CLINIC_OR_DEPARTMENT_OTHER): Payer: Self-pay

## 2024-04-02 MED ORDER — COLCHICINE 0.6 MG PO TABS
0.6000 mg | ORAL_TABLET | Freq: Every day | ORAL | 0 refills | Status: DC
  Filled 2024-04-02: qty 30, 30d supply, fill #0

## 2024-05-07 DIAGNOSIS — Z0000 Encounter for general adult medical examination without abnormal findings: Secondary | ICD-10-CM | POA: Diagnosis not present

## 2024-05-07 DIAGNOSIS — E785 Hyperlipidemia, unspecified: Secondary | ICD-10-CM | POA: Diagnosis not present

## 2024-05-07 DIAGNOSIS — M109 Gout, unspecified: Secondary | ICD-10-CM | POA: Diagnosis not present

## 2024-05-07 DIAGNOSIS — K7581 Nonalcoholic steatohepatitis (NASH): Secondary | ICD-10-CM | POA: Diagnosis not present

## 2024-05-07 DIAGNOSIS — Z125 Encounter for screening for malignant neoplasm of prostate: Secondary | ICD-10-CM | POA: Diagnosis not present

## 2024-05-07 DIAGNOSIS — Z1331 Encounter for screening for depression: Secondary | ICD-10-CM | POA: Diagnosis not present

## 2024-05-08 ENCOUNTER — Encounter: Payer: Self-pay | Admitting: Internal Medicine

## 2024-05-09 ENCOUNTER — Other Ambulatory Visit (HOSPITAL_BASED_OUTPATIENT_CLINIC_OR_DEPARTMENT_OTHER): Payer: Self-pay | Admitting: Internal Medicine

## 2024-05-09 DIAGNOSIS — K7581 Nonalcoholic steatohepatitis (NASH): Secondary | ICD-10-CM

## 2024-05-12 ENCOUNTER — Other Ambulatory Visit (HOSPITAL_BASED_OUTPATIENT_CLINIC_OR_DEPARTMENT_OTHER): Payer: Self-pay

## 2024-05-14 ENCOUNTER — Ambulatory Visit (INDEPENDENT_AMBULATORY_CARE_PROVIDER_SITE_OTHER)
Admission: RE | Admit: 2024-05-14 | Discharge: 2024-05-14 | Disposition: A | Source: Ambulatory Visit | Attending: Internal Medicine | Admitting: Internal Medicine

## 2024-05-14 ENCOUNTER — Other Ambulatory Visit (HOSPITAL_BASED_OUTPATIENT_CLINIC_OR_DEPARTMENT_OTHER): Payer: Self-pay

## 2024-05-14 DIAGNOSIS — R93422 Abnormal radiologic findings on diagnostic imaging of left kidney: Secondary | ICD-10-CM | POA: Diagnosis not present

## 2024-05-14 DIAGNOSIS — K7581 Nonalcoholic steatohepatitis (NASH): Secondary | ICD-10-CM

## 2024-05-14 DIAGNOSIS — K760 Fatty (change of) liver, not elsewhere classified: Secondary | ICD-10-CM | POA: Diagnosis not present

## 2024-05-14 DIAGNOSIS — R1900 Intra-abdominal and pelvic swelling, mass and lump, unspecified site: Secondary | ICD-10-CM | POA: Diagnosis not present

## 2024-05-14 DIAGNOSIS — N281 Cyst of kidney, acquired: Secondary | ICD-10-CM | POA: Diagnosis not present

## 2024-05-14 MED ORDER — COLCHICINE 0.6 MG PO TABS
0.6000 mg | ORAL_TABLET | Freq: Every day | ORAL | 0 refills | Status: DC
  Filled 2024-05-14: qty 30, 30d supply, fill #0

## 2024-05-15 ENCOUNTER — Ambulatory Visit (INDEPENDENT_AMBULATORY_CARE_PROVIDER_SITE_OTHER)

## 2024-05-15 DIAGNOSIS — Z1283 Encounter for screening for malignant neoplasm of skin: Secondary | ICD-10-CM

## 2024-05-15 DIAGNOSIS — W908XXA Exposure to other nonionizing radiation, initial encounter: Secondary | ICD-10-CM | POA: Diagnosis not present

## 2024-05-15 DIAGNOSIS — L578 Other skin changes due to chronic exposure to nonionizing radiation: Secondary | ICD-10-CM | POA: Diagnosis not present

## 2024-05-15 DIAGNOSIS — L570 Actinic keratosis: Secondary | ICD-10-CM

## 2024-05-15 DIAGNOSIS — L821 Other seborrheic keratosis: Secondary | ICD-10-CM

## 2024-05-15 DIAGNOSIS — D1801 Hemangioma of skin and subcutaneous tissue: Secondary | ICD-10-CM | POA: Diagnosis not present

## 2024-05-15 DIAGNOSIS — L814 Other melanin hyperpigmentation: Secondary | ICD-10-CM

## 2024-05-15 DIAGNOSIS — D229 Melanocytic nevi, unspecified: Secondary | ICD-10-CM

## 2024-05-15 NOTE — Patient Instructions (Addendum)

## 2024-05-15 NOTE — Progress Notes (Signed)
 Subjective   Stephen Vega is a 48 y.o. male who presents for the following: Total body skin exam for skin cancer screening and mole check. The patient has spots, moles and lesions to be evaluated, some may be new or changing and the patient may have concern these could be cancer.. Patient is new patient  Today patient reports: Area of concern on the back.   Review of Systems:    No other skin or systemic complaints except as noted in HPI or Assessment and Plan.  The following portions of the chart were reviewed this encounter and updated as appropriate: medications, allergies, medical history  Relevant Medical History:  n/a   Objective  Well appearing patient in no apparent distress; mood and affect are within normal limits. Examination was performed of the: Full Skin Examination: scalp, head, eyes, ears, nose, lips, neck, chest, axillae, abdomen, back, buttocks, bilateral upper extremities, bilateral lower extremities, hands, feet, fingers, toes, fingernails, and toenails.   Examination notable for: SKIN EXAM, Angioma(s): Scattered red vascular papule(s)  , Lentigo/lentigines: Scattered pigmented macules that are tan to brown in color and are somewhat non-uniform in shape and concentrated in the sun-exposed areas, Nevus/nevi: Scattered well-demarcated, regular, pigmented macule(s) and/or papule(s)  , Seborrheic Keratosis(es): Stuck-on appearing keratotic papule(s) on the trunk, none  irritated with redness, crusting, edema, and/or partial avulsion, Actinic Damage/Elastosis: chronic sun damage: dyspigmentation, telangiectasia, and wrinkling, Actinic keratosis: Scaly erythematous macule(s) concentrated on sun exposed areas   Examination limited by: Undergarments and Patient deferred removal     Scalp (7) Pink scaly macules  Assessment & Plan   SKIN CANCER SCREENING PERFORMED TODAY.  BENIGN SKIN FINDINGS  - Lentigines  - Seborrheic keratoses  - Hemangiomas   - Nevus/Multiple  Benign Nevi - Reassurance provided regarding the benign appearance of lesions noted on exam today; no treatment is indicated in the absence of symptoms/changes. - Reinforced importance of photoprotective strategies including liberal and frequent sunscreen use of a broad-spectrum SPF 30 or greater, use of protective clothing, and sun avoidance for prevention of cutaneous malignancy and photoaging.  Counseled patient on the importance of regular self-skin monitoring as well as routine clinical skin examinations as scheduled.   ACTINIC DAMAGE - Chronic condition, secondary to cumulative UV/sun exposure - Recommend daily broad spectrum sunscreen SPF 30+ to sun-exposed areas, reapply every 2 hours as needed.  - Staying in the shade or wearing long sleeves, sun glasses (UVA+UVB protection) and wide brim hats (4-inch brim around the entire circumference of the hat) are also recommended for sun protection.  - Call for new or changing lesions.   Level of service outlined above   Procedures, orders, diagnosis for this visit:  ACTINIC KERATOSIS (7) Scalp (7) Actinic keratoses are precancerous spots that appear secondary to cumulative UV radiation exposure/sun exposure over time. They are chronic with expected duration over 1 year. A portion of actinic keratoses will progress to squamous cell carcinoma of the skin. It is not possible to reliably predict which spots will progress to skin cancer and so treatment is recommended to prevent development of skin cancer.  Recommend daily broad spectrum sunscreen SPF 30+ to sun-exposed areas, reapply every 2 hours as needed.  Recommend staying in the shade or wearing long sleeves, sun glasses (UVA+UVB protection) and wide brim hats (4-inch brim around the entire circumference of the hat). Call for new or changing lesions. Destruction of lesion - Scalp (7) Complexity: simple   Destruction method: cryotherapy   Informed consent:  discussed and consent obtained    Timeout:  patient name, date of birth, surgical site, and procedure verified Lesion destroyed using liquid nitrogen: Yes   Region frozen until ice ball extended beyond lesion: Yes   Cryo cycles: 1 or 2. Outcome: patient tolerated procedure well with no complications   Post-procedure details: wound care instructions given     Actinic keratosis -     Destruction of lesion    Return to clinic: Return in about 1 year (around 05/15/2025) for TBSE.  I, Emerick Ege, CMA am acting as scribe for Lauraine JAYSON Kanaris, MD.   Documentation: I have reviewed the above documentation for accuracy and completeness, and I agree with the above.  Lauraine JAYSON Kanaris, MD

## 2024-05-22 ENCOUNTER — Ambulatory Visit: Admitting: Dermatology

## 2024-08-08 ENCOUNTER — Other Ambulatory Visit (HOSPITAL_BASED_OUTPATIENT_CLINIC_OR_DEPARTMENT_OTHER): Payer: Self-pay

## 2024-08-09 ENCOUNTER — Other Ambulatory Visit (HOSPITAL_BASED_OUTPATIENT_CLINIC_OR_DEPARTMENT_OTHER): Payer: Self-pay

## 2024-08-09 MED ORDER — COLCHICINE 0.6 MG PO TABS
ORAL_TABLET | ORAL | 1 refills | Status: AC
  Filled 2024-08-09: qty 30, 30d supply, fill #0

## 2024-08-20 ENCOUNTER — Other Ambulatory Visit (HOSPITAL_BASED_OUTPATIENT_CLINIC_OR_DEPARTMENT_OTHER): Payer: Self-pay

## 2025-05-20 ENCOUNTER — Ambulatory Visit
